# Patient Record
Sex: Female | Born: 1970 | Hispanic: Yes | Marital: Married | Smoking: Never smoker
Health system: Southern US, Community
[De-identification: ages and names within clinical notes are randomized; demographics above are authoritative.]

## PROBLEM LIST (undated history)

## (undated) DIAGNOSIS — N841 Polyp of cervix uteri: Secondary | ICD-10-CM

## (undated) DIAGNOSIS — E785 Hyperlipidemia, unspecified: Secondary | ICD-10-CM

## (undated) DIAGNOSIS — F329 Major depressive disorder, single episode, unspecified: Secondary | ICD-10-CM

## (undated) DIAGNOSIS — F32A Depression, unspecified: Secondary | ICD-10-CM

## (undated) HISTORY — DX: Depression, unspecified: F32.A

## (undated) HISTORY — DX: Hyperlipidemia, unspecified: E78.5

## (undated) HISTORY — DX: Polyp of cervix uteri: N84.1

---
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
## 2014-11-27 ENCOUNTER — Other Ambulatory Visit (HOSPITAL_COMMUNITY): Payer: Self-pay | Admitting: *Deleted

## 2014-11-27 DIAGNOSIS — R221 Localized swelling, mass and lump, neck: Secondary | ICD-10-CM

## 2014-12-02 ENCOUNTER — Ambulatory Visit (HOSPITAL_COMMUNITY)
Admission: RE | Admit: 2014-12-02 | Discharge: 2014-12-02 | Disposition: A | Discharge status: Home / Self Care | Payer: Self-pay | Source: Ambulatory Visit | Attending: *Deleted | Admitting: *Deleted

## 2014-12-02 DIAGNOSIS — R221 Localized swelling, mass and lump, neck: Secondary | ICD-10-CM

## 2014-12-02 DIAGNOSIS — E041 Nontoxic single thyroid nodule: Secondary | ICD-10-CM | POA: Insufficient documentation

## 2015-01-01 ENCOUNTER — Ambulatory Visit (INDEPENDENT_AMBULATORY_CARE_PROVIDER_SITE_OTHER): Payer: Self-pay | Admitting: Otolaryngology

## 2015-01-01 DIAGNOSIS — D440 Neoplasm of uncertain behavior of thyroid gland: Secondary | ICD-10-CM

## 2015-01-06 ENCOUNTER — Other Ambulatory Visit (INDEPENDENT_AMBULATORY_CARE_PROVIDER_SITE_OTHER): Payer: Self-pay | Admitting: Otolaryngology

## 2015-01-06 DIAGNOSIS — IMO0002 Reserved for concepts with insufficient information to code with codable children: Secondary | ICD-10-CM

## 2015-01-06 DIAGNOSIS — R229 Localized swelling, mass and lump, unspecified: Principal | ICD-10-CM

## 2015-01-16 ENCOUNTER — Other Ambulatory Visit (INDEPENDENT_AMBULATORY_CARE_PROVIDER_SITE_OTHER): Payer: Self-pay | Admitting: Otolaryngology

## 2015-01-16 ENCOUNTER — Ambulatory Visit (HOSPITAL_COMMUNITY)
Admission: RE | Admit: 2015-01-16 | Discharge: 2015-01-16 | Disposition: A | Discharge status: Home / Self Care | Payer: Self-pay | Source: Ambulatory Visit | Attending: Otolaryngology | Admitting: Otolaryngology

## 2015-01-16 DIAGNOSIS — IMO0002 Reserved for concepts with insufficient information to code with codable children: Secondary | ICD-10-CM

## 2015-01-16 DIAGNOSIS — E041 Nontoxic single thyroid nodule: Secondary | ICD-10-CM | POA: Insufficient documentation

## 2015-01-16 DIAGNOSIS — R229 Localized swelling, mass and lump, unspecified: Principal | ICD-10-CM

## 2015-01-16 MED ORDER — LIDOCAINE HCL (PF) 2 % IJ SOLN
INTRAMUSCULAR | Status: AC
  Administered 2015-01-16: 10 mL
  Filled 2015-01-16: qty 10

## 2015-01-16 NOTE — Discharge Instructions (Addendum)
Quiste en la tiroides  (Thyroid Cyst) La glndula tiroides tiene forma de [place] y se encuentra en el medio del cuello, justo por debajo de las cuerdas vocales. Produce la hormona tiroidea. La hormona tiroidea tiene efecto en casi todos los tejidos del [place], regulando el metabolismo. El metabolismo es la utilizacin de los alimentos que se consumen o de la energa almacenada en el organismo. El metabolismo afecta la frecuencia cardaca, la presin arterial, la Firefighter y [place].  Un quiste en la tiroides es una zona agrandada, llena de lquido en la glndula tiroides. Estos quistes varan en tamao y pueden expandirse y agrandarse repentinamente. La rpida expansin de un quiste puede causar dolor, dificultad para tragar y, rara vez, dificultad para respirar. La mayora de los quistes de la glndula tiroides son no cancerosos (benignos).  SNTOMAS  Dentro del quiste puede haber sangrado. Si la hemorragia es intensa, el quiste puede agrandarse y causar problemas en el cuello, por ejemplo inflamacin, que se acompaa de dolor y dificultad para tragar. Si comprime las cuerdas vocales, puede producir ronquera. Si comprime la trquea, puede causar dificultad para respirar.  DIAGNSTICO  Un quiste de la tiroides se Engineer, materials un examen fsico. El diagnstico se puede confirmar mediante una ecografa del cuello. Con este estudio se crea una imagen haciendo rebotar ondas de sonido en la glndula tiroides. En [employer], los quistes se drenan mediante una aguja fina. El lquido se enva a un laboratorio para ser examinado. Esto se hace para ver si las clulas que se encuentran en el lquido son cancerosas. Si esas clulas son cancerosas, deber Arts administrator.  TRATAMIENTO  Si el lquido del cuello no [place] evidencia de cncer, el mdico simplemente lo controlar con ecografas anuales. En algunos casos, los quistes se extirparn quirrgicamente.  Document Released:  04/18/2012 [facility] Patient Information 2015 [place]. This information is not intended to replace advice given to you by your health care provider. Make sure you discuss any questions you have with your health care provider. Biopsia de tiroides (Thyroid Biopsy) La glndula tiroides es una glndula con forma de mariposa situada en parte frontal del cuello. Produce hormonas que afectan el metabolismo, el crecimiento y el desarrollo y la Firefighter. Tara Heath biopsia de tiroides es un procedimiento mediante el cual se extraen muestras de tejido o de lquido de la glndula tiroides o de una masa y se examinan con un microscopio. Esta prueba se realiza con el objetivo de determinar la causa de problemas en la tiroides, como infeccin, cncer u otros problemas de tiroides. Hay 2 modos de obtener muestras: 1. Aspiracin con [employer] se extraen utilizando una aguja fina que se inserta a travs de la piel en la glndula tiroides o en la masa. [employer] se extraen haciendo un corte (incisin) a travs de la piel. INFORME A SU MDICO SOBRE:   Alergias.  Medicamentos que toma, incluidas hierbas, gotas oftlmicas, medicamentos de venta libre y cremas.  Uso de corticoides (por va oral o cremas).  Problemas anteriores debido a anestsicos o a medicamentos que [employer] sensibilidad.  Posibilidad de embarazo, si correspondiera.  Antecedentes de cogulos sanguneos (tromboflebitis).  Antecedentes de hemorragias o problemas sanguneos.  Cirugas previas.  Otros problemas de [place]. RIESGOS Y COMPLICACIONES  Sangrado proveniente del lugar. El riesgo de sangrado es mayor si usted sufre un trastorno hemorrgico o est tomando anticoagulantes.  Infeccin.  Lesin en estructuras cercanas a la glndula tiroides. ANTES DEL  PROCEDIMIENTO  Es un procedimiento que se realiza como paciente externo Confirme la hora en que debe llegar al procedimiento.  Confirme si debe ayunar o suspender algn medicamento. Le tomarn Truddie Coco de sangre para determinar el tiempo de coagulacin. Es posible que le den un medicamento para que pueda relajarse (sedante). PROCEDIMIENTO Aspiracin con aguja fina Durante el procedimiento permanecer despierto. Es posible que le soliciten que se recueste de espaldas, con la cabeza hacia atrs para que pueda extender el cuello. Hgale saber al mdico si no puede tolerar esta posicin. Le desinfectarn una zona del cuello. Se insertar una aguja a travs de la piel del cuello. Quizs sienta una leve molestia durante este procedimiento. Es posible que le pidan que evite toser, Electrical engineer, tragar o hacer sonidos durante algunas partes del procedimiento. Una vez que se hayan extrado las muestras de tejido o lquido, se retira la aguja. Se puede aplicar presin en el cuello para reducir la hinchazn y asegurarse de que se haya detenido el sangrado. Las muestras se envan para ser examinadas.  Biopsia abierta Recibir anestesia general. Estar dormido durante el procedimiento. Se hace una incisin en el cuello. Se retira una muestra de tejido de tiroides o de la masa. Las muestras de tejido o masa se envan para ser examinadas. Quizs se examinen las muestras o masa durante la biopsia. Si en la muestra o en la masa se detecta la presencia de clulas cancerosas, es posible que se retire toda o parte de la glndula tiroides. La incisin se cierra con puntos de sutura. DESPUS DEL PROCEDIMIENTO  Evaluarn y controlarn su recuperacin. Si no hay problemas, podr volver a su casa poco tiempo despus del procedimiento Si se le realiz una biopsia con aguja:  Puede experimentar molestias en el lugar de la biopsia durante 1 o 2 das. Si se le realiz una biopsia abierta:   Puede experimentar molestias en el lugar de la biopsia durante 3 o 4 das.  Quizs tenga la voz ronca o le duela la garganta durante 1 o 2 das. Obtencin de los  Edisto de las pruebas Es su responsabilidad retirar el resultado del [place]. No suponga que es normal si no tiene noticias de su mdico o del establecimiento de salud. Es Building services engineer seguimiento de todos los [place] de [place]. INSTRUCCIONES PARA EL CUIDADO EN EL HOGAR   Mantener la cabeza elevada en una almohada cuando est recostado puede ayudar a Public house manager las [employer] en el lugar de la biopsia.  Sostenerse la parte posterior de la cabeza y el cuello con ambas manos al incorporarse cuando est recostado puede ayudarle a Public house manager las molestias en el lugar de la biopsia.  Utilice los medicamentos de venta libre o recetados para Glass blower/designer, el malestar o la fiebre, segn se lo indique el mdico.  Las pastillas para la garganta o las grgaras con agua tibia y sal pueden ayudarle a Best boy de Investment banker, operational. SOLICITE ATENCIN MDICA DE INMEDIATO SI:   Tiene sangrado abundante en el lugar de la biopsia.  Tiene dificultad para tragar.  Tiene fiebre.  Aument el dolor, la hinchazn, el enrojecimiento o Engineer, production de la biopsia.  Tiene pus en el lugar de la biopsia.  Las glndulas del cuello (ganglios linfticos) estn inflamados. Document Released: 02/20/2013 [facility] Patient Information 2015 [place]. This information is not intended to replace advice given to you by your health care provider. Make sure you discuss any questions you have with your health care  provider.

## 2015-01-16 NOTE — Procedures (Signed)
PreOperative Dx: Complicated cystic lesion RT thyroid lobe Postoperative Dx: Complicated cystic lesion RT thyroid lobe Procedure:   US guided aspiration of cystic RT thyroid lesion Radiologist:  Thornton Papas Anesthesia:  2 ml of 2% lidocaine Specimen:  6.5 ml of greenish brown fluid EBL:   < 1 ml Complications: None

## 2015-02-12 ENCOUNTER — Ambulatory Visit (INDEPENDENT_AMBULATORY_CARE_PROVIDER_SITE_OTHER): Payer: Self-pay | Admitting: Otolaryngology

## 2015-02-12 DIAGNOSIS — D440 Neoplasm of uncertain behavior of thyroid gland: Secondary | ICD-10-CM

## 2015-08-11 ENCOUNTER — Other Ambulatory Visit (INDEPENDENT_AMBULATORY_CARE_PROVIDER_SITE_OTHER): Payer: Self-pay | Admitting: Otolaryngology

## 2015-08-11 DIAGNOSIS — E079 Disorder of thyroid, unspecified: Secondary | ICD-10-CM

## 2015-08-14 ENCOUNTER — Ambulatory Visit (HOSPITAL_COMMUNITY): Admission: RE | Admit: 2015-08-14 | Payer: Self-pay | Source: Ambulatory Visit

## 2016-01-28 ENCOUNTER — Ambulatory Visit (INDEPENDENT_AMBULATORY_CARE_PROVIDER_SITE_OTHER): Payer: Self-pay | Admitting: Otolaryngology

## 2016-05-13 ENCOUNTER — Other Ambulatory Visit (INDEPENDENT_AMBULATORY_CARE_PROVIDER_SITE_OTHER): Payer: Self-pay | Admitting: Otolaryngology

## 2016-05-13 DIAGNOSIS — E041 Nontoxic single thyroid nodule: Secondary | ICD-10-CM

## 2016-05-19 ENCOUNTER — Ambulatory Visit (HOSPITAL_COMMUNITY)
Admission: RE | Admit: 2016-05-19 | Discharge: 2016-05-19 | Disposition: A | Discharge status: Home / Self Care | Payer: BLUE CROSS/BLUE SHIELD | Source: Ambulatory Visit | Attending: Otolaryngology | Admitting: Otolaryngology

## 2016-05-19 DIAGNOSIS — E041 Nontoxic single thyroid nodule: Secondary | ICD-10-CM

## 2016-06-16 ENCOUNTER — Ambulatory Visit (INDEPENDENT_AMBULATORY_CARE_PROVIDER_SITE_OTHER): Payer: BLUE CROSS/BLUE SHIELD | Admitting: Otolaryngology

## 2016-06-16 DIAGNOSIS — D440 Neoplasm of uncertain behavior of thyroid gland: Secondary | ICD-10-CM

## 2017-06-19 ENCOUNTER — Ambulatory Visit (INDEPENDENT_AMBULATORY_CARE_PROVIDER_SITE_OTHER): Payer: BLUE CROSS/BLUE SHIELD | Admitting: Otolaryngology

## 2017-06-19 DIAGNOSIS — D440 Neoplasm of uncertain behavior of thyroid gland: Secondary | ICD-10-CM

## 2017-06-19 DIAGNOSIS — J310 Chronic rhinitis: Secondary | ICD-10-CM | POA: Diagnosis not present

## 2018-05-28 ENCOUNTER — Other Ambulatory Visit: Payer: Self-pay | Admitting: Otolaryngology

## 2018-05-28 DIAGNOSIS — E041 Nontoxic single thyroid nodule: Secondary | ICD-10-CM

## 2018-06-01 ENCOUNTER — Encounter (HOSPITAL_COMMUNITY): Payer: Self-pay

## 2018-06-01 ENCOUNTER — Ambulatory Visit (HOSPITAL_COMMUNITY): Payer: PRIVATE HEALTH INSURANCE

## 2019-03-20 ENCOUNTER — Other Ambulatory Visit (HOSPITAL_COMMUNITY): Payer: Self-pay | Admitting: Family Medicine

## 2019-03-20 DIAGNOSIS — Z1231 Encounter for screening mammogram for malignant neoplasm of breast: Secondary | ICD-10-CM

## 2019-03-28 ENCOUNTER — Other Ambulatory Visit: Payer: Self-pay

## 2019-03-28 ENCOUNTER — Encounter (HOSPITAL_COMMUNITY): Payer: Self-pay

## 2019-03-28 ENCOUNTER — Ambulatory Visit (HOSPITAL_COMMUNITY)
Admission: RE | Admit: 2019-03-28 | Discharge: 2019-03-28 | Disposition: A | Discharge status: Home / Self Care | Payer: PRIVATE HEALTH INSURANCE | Source: Ambulatory Visit | Attending: Family Medicine | Admitting: Family Medicine

## 2019-03-28 DIAGNOSIS — Z1231 Encounter for screening mammogram for malignant neoplasm of breast: Secondary | ICD-10-CM | POA: Insufficient documentation

## 2019-09-30 ENCOUNTER — Other Ambulatory Visit: Payer: PRIVATE HEALTH INSURANCE | Admitting: Adult Health

## 2019-10-03 ENCOUNTER — Ambulatory Visit (INDEPENDENT_AMBULATORY_CARE_PROVIDER_SITE_OTHER): Payer: 59 | Admitting: Adult Health

## 2019-10-03 ENCOUNTER — Other Ambulatory Visit: Payer: Self-pay

## 2019-10-03 ENCOUNTER — Encounter: Payer: Self-pay | Admitting: Adult Health

## 2019-10-03 VITALS — BP 124/79 | HR 73 | Ht 62.75 in | Wt 149.0 lb

## 2019-10-03 DIAGNOSIS — Z01419 Encounter for gynecological examination (general) (routine) without abnormal findings: Secondary | ICD-10-CM | POA: Diagnosis not present

## 2019-10-03 NOTE — Progress Notes (Signed)
Patient ID: Tara Heath, female   DOB: 07-Nov-1970, 49 y.o.   MRN: [id number] History of Present Illness: Kaelah is a 49 year old Hispanic female, married, G3P3 in for a well woman gyn exam.Had normal pap 11/2017.Sharyn Lull is interrupter today. PCP Dr Channing Mutters has to change due to insurance   Current Medications, Allergies, Past Medical History, Past Surgical History, Family History and Social History were reviewed in [employer] record.     Review of Systems: Patient denies any  hearing loss, fatigue, blurred vision, shortness of breath, chest pain, abdominal pain, problems with bowel movements, urination, or intercourse. No joint pain or mood swings. Periods monthly Has headaches most days she says PCP told her sinus    Physical Exam:BP 124/79 (BP Location: Left Arm, Patient Position: Sitting, Cuff Size: Normal)   Pulse 73   Ht 5' 2.75" (1.594 m)   Wt 149 lb (67.6 kg)   LMP 10/01/2019   BMI 26.60 kg/m  General:  Well developed, well nourished, no acute distress Skin:  Warm and dry Neck:  Midline trachea, normal thyroid, good ROM, no lymphadenopathy Lungs; Clear to auscultation bilaterally Breast:  No dominant palpable mass, retraction, or nipple discharge Cardiovascular: Regular rate and rhythm Abdomen:  Soft, non tender, no hepatosplenomegaly Pelvic:  External genitalia is normal in appearance, no lesions.  The vagina is normal in appearance. +period blood in vault. Urethra has no lesions or masses. The cervix is bulbous.  Uterus is felt to be normal size, shape, and contour.  No adnexal masses or tenderness noted.Bladder is non tender, no masses felt. Rectal: Good sphincter tone, no polyps, or hemorrhoids felt.  Did not have hemoccult done due to period blood. Extremities/musculoskeletal:  No swelling or varicosities noted, no clubbing or cyanosis Psych:  No mood changes, alert and cooperative,seems happy Fall risk is low PHQ 9 score is 3  Impression and  Plan:   1. Encounter for well woman exam with routine gynecological exam Physical and pap in 1 year Check CBC,CMP,TSH and lipids Mammogram yearly Colonoscopy at 1

## 2019-11-20 LAB — CBC
Hematocrit: 39.8 % (ref 34.0–46.6)
Hemoglobin: 13.5 g/dL (ref 11.1–15.9)
MCH: 30 pg (ref 26.6–33.0)
MCHC: 33.9 g/dL (ref 31.5–35.7)
MCV: 88 fL (ref 79–97)
Platelets: 264 10*3/uL (ref 150–450)
RBC: 4.5 x10E6/uL (ref 3.77–5.28)
RDW: 12.7 % (ref 11.7–15.4)
WBC: 8.8 10*3/uL (ref 3.4–10.8)

## 2019-11-20 LAB — COMPREHENSIVE METABOLIC PANEL
ALT: 22 IU/L (ref 0–32)
AST: 21 IU/L (ref 0–40)
Albumin/Globulin Ratio: 1.6 (ref 1.2–2.2)
Albumin: 4.6 g/dL (ref 3.8–4.8)
Alkaline Phosphatase: 72 IU/L (ref 48–121)
BUN/Creatinine Ratio: 15 (ref 9–23)
BUN: 10 mg/dL (ref 6–24)
Bilirubin Total: 0.5 mg/dL (ref 0.0–1.2)
CO2: 25 mmol/L (ref 20–29)
Calcium: 9.9 mg/dL (ref 8.7–10.2)
Chloride: 102 mmol/L (ref 96–106)
Creatinine, Ser: 0.68 mg/dL (ref 0.57–1.00)
GFR calc Af Amer: 119 mL/min/{1.73_m2} (ref >59)
GFR calc non Af Amer: 103 mL/min/{1.73_m2} (ref >59)
Globulin, Total: 2.9 g/dL (ref 1.5–4.5)
Glucose: 86 mg/dL (ref 65–99)
Potassium: 4 mmol/L (ref 3.5–5.2)
Sodium: 139 mmol/L (ref 134–144)
Total Protein: 7.5 g/dL (ref 6.0–8.5)

## 2019-11-20 LAB — LIPID PANEL
Chol/HDL Ratio: 3.3 ratio (ref 0.0–4.4)
Cholesterol, Total: 211 mg/dL — ABNORMAL HIGH (ref 100–199)
HDL: 64 mg/dL (ref >39)
LDL Chol Calc (NIH): 135 mg/dL — ABNORMAL HIGH (ref 0–99)
Triglycerides: 70 mg/dL (ref 0–149)
VLDL Cholesterol Cal: 12 mg/dL (ref 5–40)

## 2019-11-20 LAB — TSH: TSH: 1.38 u[IU]/mL (ref 0.450–4.500)

## 2019-11-22 ENCOUNTER — Telehealth: Payer: Self-pay | Admitting: Adult Health

## 2019-11-22 NOTE — Telephone Encounter (Signed)
I contacted patient regarding her lab results and explained exactly what the provider has stated to her in [place], pt stated that she understood what I explained.

## 2019-12-24 ENCOUNTER — Telehealth: Payer: Self-pay | Admitting: Adult Health

## 2019-12-24 NOTE — Telephone Encounter (Signed)
Left message letting pt know results from July. See results and Jenn's note. [place]

## 2019-12-24 NOTE — Telephone Encounter (Signed)
Patient would like results from labs done in July

## 2020-01-07 ENCOUNTER — Other Ambulatory Visit (HOSPITAL_COMMUNITY): Payer: Self-pay | Admitting: Otolaryngology

## 2020-01-07 ENCOUNTER — Other Ambulatory Visit: Payer: Self-pay | Admitting: Otolaryngology

## 2020-01-07 DIAGNOSIS — E041 Nontoxic single thyroid nodule: Secondary | ICD-10-CM

## 2020-01-17 ENCOUNTER — Other Ambulatory Visit: Payer: Self-pay

## 2020-01-17 ENCOUNTER — Ambulatory Visit (HOSPITAL_COMMUNITY)
Admission: RE | Admit: 2020-01-17 | Discharge: 2020-01-17 | Disposition: A | Discharge status: Home / Self Care | Payer: 59 | Source: Ambulatory Visit | Attending: Otolaryngology | Admitting: Otolaryngology

## 2020-01-17 DIAGNOSIS — E041 Nontoxic single thyroid nodule: Secondary | ICD-10-CM | POA: Diagnosis present

## 2020-03-24 ENCOUNTER — Other Ambulatory Visit (HOSPITAL_COMMUNITY): Payer: Self-pay | Admitting: Family Medicine

## 2020-03-24 DIAGNOSIS — Z1231 Encounter for screening mammogram for malignant neoplasm of breast: Secondary | ICD-10-CM

## 2020-04-01 ENCOUNTER — Ambulatory Visit (HOSPITAL_COMMUNITY)
Admission: RE | Admit: 2020-04-01 | Discharge: 2020-04-01 | Disposition: A | Discharge status: Home / Self Care | Payer: 59 | Source: Ambulatory Visit | Attending: Family Medicine | Admitting: Family Medicine

## 2020-04-01 ENCOUNTER — Other Ambulatory Visit: Payer: Self-pay

## 2020-04-01 DIAGNOSIS — Z1231 Encounter for screening mammogram for malignant neoplasm of breast: Secondary | ICD-10-CM | POA: Diagnosis present

## 2020-09-25 ENCOUNTER — Encounter: Payer: 59 | Admitting: Women's Health

## 2020-09-25 ENCOUNTER — Other Ambulatory Visit: Payer: Self-pay

## 2020-09-28 NOTE — Progress Notes (Signed)
This encounter was created in error - please disregard.

## 2020-10-07 ENCOUNTER — Ambulatory Visit (INDEPENDENT_AMBULATORY_CARE_PROVIDER_SITE_OTHER): Payer: 59 | Admitting: Women's Health

## 2020-10-07 ENCOUNTER — Other Ambulatory Visit (HOSPITAL_COMMUNITY)
Admission: RE | Admit: 2020-10-07 | Discharge: 2020-10-07 | Disposition: A | Discharge status: Home / Self Care | Payer: 59 | Source: Ambulatory Visit | Attending: Obstetrics & Gynecology | Admitting: Obstetrics & Gynecology

## 2020-10-07 ENCOUNTER — Other Ambulatory Visit: Payer: Self-pay

## 2020-10-07 ENCOUNTER — Encounter: Payer: Self-pay | Admitting: Women's Health

## 2020-10-07 VITALS — BP 109/67 | HR 71 | Ht 62.0 in | Wt 167.0 lb

## 2020-10-07 DIAGNOSIS — N926 Irregular menstruation, unspecified: Secondary | ICD-10-CM | POA: Diagnosis not present

## 2020-10-07 DIAGNOSIS — Z01419 Encounter for gynecological examination (general) (routine) without abnormal findings: Secondary | ICD-10-CM | POA: Diagnosis not present

## 2020-10-07 DIAGNOSIS — Z1212 Encounter for screening for malignant neoplasm of rectum: Secondary | ICD-10-CM

## 2020-10-07 DIAGNOSIS — Z1211 Encounter for screening for malignant neoplasm of colon: Secondary | ICD-10-CM

## 2020-10-07 DIAGNOSIS — N841 Polyp of cervix uteri: Secondary | ICD-10-CM | POA: Diagnosis not present

## 2020-10-07 NOTE — Progress Notes (Signed)
WELL-WOMAN EXAMINATION Patient name: Tara Heath MRN [id number]  Date of birth: 1970/06/26 Chief Complaint:   Annual Exam (Longer periods)  History of Present Illness:   Tara Heath is a 50 y.o. G87P3003 Hispanic female being seen today for a routine well-woman exam.  Current complaints: longer periods for a little over a year. One/mth, 3-4d of lighter spotting, then 5d normal period, then 3-4d of lighter spotting after. Changes pad 2x/day, no cramps/pain, small 'snake-like' clots in toilet. Denies abnormal discharge, itching/odor/irritation.  Unsure of mom's age when she went through menopause. Does have some vaginal dryness, no hot flashes/night sweats/mood swings.  H/O cervical polyp  Depression screen [facility] 2/9 10/03/2019  Decreased Interest 1  Down, Depressed, Hopeless 1  PHQ - 2 Score 2  Altered sleeping 0  Tired, decreased energy 0  Change in appetite 1  Feeling bad or failure about yourself  0  Trouble concentrating 0  Moving slowly or fidgety/restless 0  Suicidal thoughts 0  PHQ-9 Score 3     PCP: Don-Diego      does not desire labs Patient's last menstrual period was 09/14/2020. The current method of family planning is husband out of country.  Last pap 11/27/17. Results were: ASCUS w/ HRHPV negative. H/O abnormal pap: yes Last mammogram: 04/01/20. Results were: normal. Family h/o breast cancer: yes MA in 20s Last colonoscopy: never. Results were: N/A. Family h/o colorectal cancer: no Review of Systems:   Pertinent items are noted in HPI Denies any headaches, blurred vision, fatigue, shortness of breath, chest pain, abdominal pain, abnormal vaginal discharge/itching/odor/irritation, problems with periods, bowel movements, urination, or intercourse unless otherwise stated above. Pertinent History Reviewed:  Reviewed past medical,surgical, social and family history.  Reviewed problem list, medications and allergies. Physical Assessment:   Vitals:   10/07/20 0909   BP: 109/67  Pulse: 71  Weight: 167 lb (75.8 kg)  Height: 5\' 2"  (1.575 m)  Body mass index is 30.54 kg/m.        Physical Examination:   General appearance - well appearing, and in no distress  Mental status - alert, oriented to person, place, and time  Psych:  She has a normal mood and affect  Skin - warm and dry, normal color, no suspicious lesions noted  Chest - effort normal, all lung fields clear to auscultation bilaterally  Heart - normal rate and regular rhythm  Neck:  midline trachea, no thyromegaly or nodules  Breasts - breasts appear normal, no suspicious masses, no skin or nipple changes or  axillary nodes  Abdomen - soft, nontender, nondistended, no masses or organomegaly  Pelvic - VULVA: normal appearing vulva with no masses, tenderness or lesions  VAGINA: normal appearing vagina with normal color and discharge, no lesions  CERVIX: normal appearing cervix without discharge or lesions, no CMT, small flat appearing polyp at os  Thin prep pap is done w/ HR HPV cotesting  UTERUS: uterus is felt to be normal size, shape, consistency and nontender   ADNEXA: No adnexal masses or tenderness noted.  Extremities:  No swelling or varicosities noted  Chaperone: Latisha Cresenzo    No results found for this or any previous visit (from the past 24 hour(s)).  Assessment & Plan:  1) Well-Woman Exam  2) Prolonged periods> check gc/ct w/ pap, will get pelvic u/s (order placed and note routed to [facility] to schedule/notify pt). Has small flat cervical polyp. Could also be perimenopausal.  Will discuss further w/ pt after u/s. Does not want meds/birth control  to regulate if everything comes back normal.   3) Small flat cervical polyp> h/o same, will discuss further after u/s  4) Needs for screening TCS> ordered referral to GI  Labs/procedures today: pap w/ gc/ct  Mammogram: after 11/17, or sooner if problems Colonoscopy: schedule screening colonoscopy as soon as possible   Orders  Placed This Encounter  Procedures  . US PELVIS (TRANSABDOMINAL ONLY)  . US PELVIS TRANSVAGINAL NON-OB (TV ONLY)  . Ambulatory referral to Gastroenterology    Meds: No orders of the defined types were placed in this encounter.   Follow-up: No follow-ups on file.  [place], [facility] 10/07/2020 9:41 AM

## 2020-10-07 NOTE — Patient Instructions (Signed)
Schedule your mammogram for after 11/17 this year

## 2020-10-08 LAB — CYTOLOGY - PAP
Chlamydia: NEGATIVE
Comment: NEGATIVE
Comment: NEGATIVE
Comment: NORMAL
Diagnosis: NEGATIVE
High risk HPV: NEGATIVE
Neisseria Gonorrhea: NEGATIVE

## 2020-10-16 ENCOUNTER — Ambulatory Visit (HOSPITAL_COMMUNITY): Payer: 59

## 2020-10-22 ENCOUNTER — Ambulatory Visit (HOSPITAL_COMMUNITY)
Admission: RE | Admit: 2020-10-22 | Discharge: 2020-10-22 | Disposition: A | Discharge status: Home / Self Care | Payer: 59 | Source: Ambulatory Visit | Attending: Women's Health | Admitting: Women's Health

## 2020-10-22 ENCOUNTER — Other Ambulatory Visit: Payer: Self-pay | Admitting: Women's Health

## 2020-10-22 DIAGNOSIS — N926 Irregular menstruation, unspecified: Secondary | ICD-10-CM

## 2020-10-23 ENCOUNTER — Encounter: Payer: Self-pay | Admitting: *Deleted

## 2020-11-23 ENCOUNTER — Ambulatory Visit: Payer: 59

## 2020-11-24 ENCOUNTER — Encounter (HOSPITAL_COMMUNITY): Payer: Self-pay

## 2020-11-24 ENCOUNTER — Ambulatory Visit (HOSPITAL_COMMUNITY): Payer: 59 | Attending: Internal Medicine

## 2020-11-24 ENCOUNTER — Other Ambulatory Visit: Payer: Self-pay

## 2020-11-24 DIAGNOSIS — M25521 Pain in right elbow: Secondary | ICD-10-CM

## 2020-11-24 DIAGNOSIS — M25522 Pain in left elbow: Secondary | ICD-10-CM | POA: Insufficient documentation

## 2020-11-24 NOTE — Therapy (Signed)
[facility] [address] [place], [place], [address] Phone: [phone]   Fax:  [phone]  Occupational Therapy Evaluation  Patient Details  Name: Tara Heath MRN: [id number] Date of Birth: 1970/11/15 Referring Provider (OT): Roberto Scales, MD   Encounter Date: 11/24/2020   OT End of Session - 11/24/20 1105     Visit Number 1    Number of Visits 2    Date for OT Re-Evaluation 12/22/20    Authorization Type [employer]    Authorization Time Period [place] $10, no authorization, visit limit: 30    Authorization - Visit Number 1    Authorization - Number of Visits 30    OT Start Time 0955    OT Stop Time 1517    OT Time Calculation (min) 38 min    Activity Tolerance Patient tolerated treatment well    Behavior During Therapy [facility] for tasks assessed/performed           Pain:  Upon waking: right: 8/10, left: 6/10 Working: right: 6/10, left: 6/10  Past Medical History:  Diagnosis Date   Cervical polyp    Depression    Hyperlipidemia     History reviewed. No pertinent surgical history.  There were no vitals filed for this visit.   Subjective Assessment - 11/24/20 1000     Subjective  S: I try to keep my elbows straight at night.    Patient is accompanied by: Interpreter    Pertinent History Patient is a 50 y/o female presenting with bilateral hand numbness and pain which has been ongoing for at least a year. Patient reports that she experiences constant pain with it aggrevated first thing in the morning and when completing work tasks. When elbows are bent, she experiences bilateral numbness in her middle, ring, and small fingers which decreases when elbows are extended. These symptoms mimicing cubital tunnel syndrome. Dr. Jimmye Norman has referred patient to occupational therapy for evaluation and treatment.    Patient Stated Goals To decrease pain and numbness    Currently in Pain? Yes    Pain Score 3     Pain Location  Hand    Pain Orientation Right    Pain Descriptors / Indicators Aching;Constant    Pain Type Chronic pain    Pain Onset More than a month ago    Pain Frequency Constant    Aggravating Factors  worse upon waking up in the morning, work related tasks, elbow bent (sleeping and driving)    Pain Relieving Factors extending elbow straight    Effect of Pain on Daily Activities severe effect    Multiple Pain Sites Yes    Pain Score 1    Pain Location Hand    Pain Orientation Left    Pain Descriptors / Indicators Aching;Constant    Pain Type Chronic pain    Pain Onset More than a month ago    Pain Frequency Constant    Aggravating Factors  worse upon waking up in the morning, work related tasks, elbow bent (sleeping and driving)    Pain Relieving Factors extending elbow straight    Effect of Pain on Daily Activities severe               [facility] OT Assessment - 11/24/20 1009       Assessment   Medical Diagnosis bilateral hand numbness/pain    Referring Provider (OT) Roberto Scales, MD    Onset Date/Surgical Date --   1 year ago  Hand Dominance Right    Next MD Visit --   in 6 months   Prior Therapy None      Precautions   Precautions None      Restrictions   Weight Bearing Restrictions No      Balance Screen   Has the patient fallen in the past 6 months No      Home  Environment   Family/patient expects to be discharged to: Private residence      Prior Function   Level of Independence Independent    Vocation Full time employment    Vocation Requirements 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      ADL   ADL comments Pt reports increased pain and numbness when completing work tasks, driving and holding steering wheel with elbow bent, when attempting to sleep at night and elbow are bent, unable to form a fist with either hand first thing in the morning.      Mobility   Mobility Status Independent      Written  Expression   Dominant Hand Right      Vision - History   Baseline Vision No visual deficits      Cognition   Overall Cognitive Status Within Functional Limits for tasks assessed      Observation/Other Assessments   Focus on Therapeutic Outcomes (FOTO)  N/A      Sensation   Light Touch Appears Intact    Stereognosis Appears Intact    Hot/Cold Appears Intact    Proprioception Appears Intact    Additional Comments Numbness aggrevated when elbow bent. Numbness occurs in bilateral middle, ring, and small fingers.      Coordination   Gross Motor Movements are Fluid and Coordinated Yes    Fine Motor Movements are Fluid and Coordinated Yes    Coordination WNL      Edema   Edema None noted.      ROM / Strength   AROM / PROM / Strength Strength;AROM      AROM   Overall AROM  Within functional limits for tasks performed    Overall AROM Comments bilateral shoulders, wrist,elbows, and hands.      Strength   Overall Strength Comments BUE strength is WFL in all ranges for shoulder, elbow, forearm, and wrists.    Strength Assessment Site Hand    Right/Left hand Right;Left    Right Hand Grip (lbs) 65    Left Hand Grip (lbs) 60                             OT Education - 11/24/20 1103     Education Details Education provided regarding activity/task modifications at work, home, when driving and sleeping. Ulnar nerve glides provided for HEP. Anti-vibration gloves recommended for work. Bilateral elbow splints for night time.    Person(s) Educated Patient    Methods Explanation;Demonstration;Verbal cues;Handout    Comprehension Returned demonstration;Verbalized understanding              OT Short Term Goals - 11/24/20 1114       OT SHORT TERM GOAL #1   Title Patient will be educated and verbalize understanding of HEP and education provided in order to decrease the intensity/occurance of numbness and pain that is experienced bilaterally.    Time 4    Period  Weeks    Status New    Target Date 12/22/20  OT SHORT TERM GOAL #2   Title Patient will be provided with appropriate nightime elbow splints while either verbalizing or understanding donning/doffing technique, wearing schedule, precautions, and care.    Time 4    Period Weeks    Status New                      Plan - 11/24/20 1109     Clinical Impression Statement A: Patient is a 50 y/o female experiencing bilateral hand pain and numbness which appears to be caused from work required tasks and excessive elbow flexion resulting in difficulty in all aspects of work, sleep, and ADL tasks. Discussed the cause of numbness related to ulnar nerve compression. Provide ulnar nerve glides, recommended eliminating excessive elbow flexion when able, use of anti-vibration gloves, and utilzing elbow splints at night for ideal elbow positioning.    OT Occupational Profile and History Problem Focused Assessment - Including review of records relating to presenting problem    Occupational performance deficits (Please refer to evaluation for details): ADL's;Rest and Sleep;Work;Leisure    Rehab Potential Excellent    Clinical Decision Making Limited treatment options, no task modification necessary    Comorbidities Affecting Occupational Performance: None    Modification or Assistance to Complete Evaluation  No modification of tasks or assist necessary to complete eval    OT Frequency Other (comment)   1 follow up visit for splint fabrication   OT Duration 4 weeks    OT Treatment/Interventions Patient/family education;Splinting;Self-care/ADL training    Plan P: Patient will benefit from skilled OT services to provide education and splint fabrication for bilateral elbows in order to assist with decreasing pain and numbness that is felt during daily tasks. Treatment plan: fabricate bilateral elbow splints for night time use. 90 minute session needed. Follow up on ulnar nerve glides.    OT Home  Exercise Plan eval: ulnar nerve glides    Consulted and Agree with Plan of Care Patient             Patient will benefit from skilled therapeutic intervention in order to improve the following deficits and impairments:           Visit Diagnosis: Pain in left elbow - Plan: Ot plan of care cert/re-cert  Pain in right elbow - Plan: Ot plan of care cert/re-cert    Problem List Patient Active Problem List   Diagnosis Date Noted   Cervical polyp 10/07/2020    Ailene Ravel, OTR/L,CBIS  [phone]   11/24/2020, 11:29 AM  [place], [place], [address] Phone: [phone]   Fax:  [phone]  Name: Tara Heath MRN: [id number] Date of Birth: 1970/06/08

## 2020-12-03 ENCOUNTER — Other Ambulatory Visit: Payer: Self-pay

## 2020-12-03 ENCOUNTER — Telehealth: Payer: Self-pay | Admitting: *Deleted

## 2020-12-03 ENCOUNTER — Ambulatory Visit (INDEPENDENT_AMBULATORY_CARE_PROVIDER_SITE_OTHER): Payer: Self-pay | Admitting: *Deleted

## 2020-12-03 VITALS — Ht 65.0 in | Wt 165.6 lb

## 2020-12-03 DIAGNOSIS — Z1211 Encounter for screening for malignant neoplasm of colon: Secondary | ICD-10-CM

## 2020-12-03 MED ORDER — NA SULFATE-K SULFATE-MG SULF 17.5-3.13-1.6 GM/177ML PO SOLN: 1.0000 | Freq: Once | ORAL | 0 refills | Status: AC

## 2020-12-03 NOTE — Patient Instructions (Addendum)
Tara Heath  May 30, 1970 MRN: [id number]     Procedure Date: 12/22/2020 Time to register: 10:30 am Place to register: [facility] [facility] Scheduled provider: Dr. Abbey Chatters    PREPARATION FOR COLONOSCOPY WITH SUPREP BOWEL PREP KIT  Note: Suprep Bowel Prep Kit is a split-dose (2day) regimen. Consumption of BOTH 6-ounce bottles is required for a complete prep.  Please notify us immediately if you are diabetic, take iron supplements, or if you are on Coumadin or any other blood thinners.  Please hold the following medications: n/a                                                                                                                                                  2 DAYS BEFORE PROCEDURE:  DATE: 12/20/2020   DAY: Sunday Begin clear liquid diet AFTER your lunch meal. NO SOLID FOODS after this point.  1 DAY BEFORE PROCEDURE:  DATE: 12/21/2020   DAY: Monday Continue clear liquids the entire day - NO SOLID FOOD.   Diabetic medications adjustments for today: n/a  At 6:00pm: Complete steps 1 through 4 below, using ONE (1) 6-ounce bottle, before going to bed. Step 1:  Pour ONE (1) 6-ounce bottle of SUPREP liquid into the mixing container.  Step 2:  Add cool drinking water to the 16 ounce line on the container and mix.  Note: Dilute the solution concentrate as directed prior to use. Step 3:  DRINK ALL the liquid in the container. Step 4:  You MUST drink an additional two (2) or more 16 ounce containers of water over the next one (1) hour.   Continue clear liquids.  DAY OF PROCEDURE:   DATE: 12/22/2020   DAY: Tuesday If you take medications for your heart, blood pressure, or breathing, you may take these medications.  Diabetic medications adjustments for today:  n/a  5 hours before your procedure at 7:00 am : Step 1:  Pour ONE (1) 6-ounce bottle of SUPREP liquid into the mixing container.  Step 2:  Add cool drinking water to the 16 ounce line on the container and mix.  Note: Dilute  the solution concentrate as directed prior to use. Step 3:  DRINK ALL the liquid in the container. Step 4:  You MUST drink an additional two (2) or more 16 ounce containers of water over the next one (1) hour. You MUST complete the final glass of water at least 3 hours before your colonoscopy. Nothing by mouth past 9:00 am.  You may take your morning medications with sip of water unless we have instructed otherwise.    Please see below for Dietary Information.  CLEAR LIQUIDS INCLUDE:  Water Jello (NOT red in color)   Ice Popsicles (NOT red in color)   Tea (sugar ok, no milk/cream) Powdered fruit flavored drinks  Coffee (sugar ok, no milk/cream) Gatorade/ Lemonade/  Kool-Aid  (NOT red in color)   Juice: apple, white grape, white cranberry Soft drinks  Clear bullion, consomme, broth (fat free beef/chicken/vegetable)  Carbonated beverages (any kind)  Strained chicken noodle soup Hard Candy   Remember: Clear liquids are liquids that will allow you to see your fingers on the other side of a clear glass. Be sure liquids are NOT red in color, and not cloudy, but CLEAR.  DO NOT EAT OR DRINK ANY OF THE FOLLOWING:  Dairy products of any kind   Cranberry juice Tomato juice / V8 juice   Grapefruit juice Orange juice     Red grape juice  Do not eat any solid foods, including such foods as: cereal, oatmeal, yogurt, fruits, vegetables, creamed soups, eggs, bread, crackers, pureed foods in a blender, etc.   HELPFUL HINTS FOR DRINKING PREP SOLUTION:  Make sure prep is extremely cold. Mix and refrigerate the the morning of the prep. You may also put in the freezer.  You may try mixing some Crystal Light or Country Time Lemonade if you prefer. Mix in small amounts; add more if necessary. Try drinking through a straw Rinse mouth with water or a mouthwash between glasses, to remove after-taste. Try sipping on a cold beverage /ice/ popsicles between glasses of prep. Place a piece of sugar-free hard candy  in mouth between glasses. If you become nauseated, try consuming smaller amounts, or stretch out the time between glasses. Stop for 30-60 minutes, then slowly start back drinking.     OTHER INSTRUCTIONS  You will need a responsible adult at least 50 years of age to accompany you and drive you home. This person must remain in the waiting room during your procedure. The hospital will cancel your procedure if you do not have a responsible adult with you.   Wear loose fitting clothing that is easily removed. Leave jewelry and other valuables at home.  Remove all body piercing jewelry and leave at home. Total time from sign-in until discharge is approximately 2-3 hours. You should go home directly after your procedure and rest. You can resume normal activities the day after your procedure. The day of your procedure you should not: Drive Make legal decisions Operate machinery Drink alcohol Return to work   You may call the office (Dept: [phone]) before 5:00pm, or page the doctor on call [phone]) after 5:00pm, for further instructions, if necessary.   Insurance Information YOU WILL NEED TO CHECK WITH YOUR INSURANCE COMPANY FOR THE BENEFITS OF COVERAGE YOU HAVE FOR THIS PROCEDURE.  UNFORTUNATELY, NOT ALL INSURANCE COMPANIES HAVE BENEFITS TO COVER ALL OR PART OF THESE TYPES OF PROCEDURES.  IT IS YOUR RESPONSIBILITY TO CHECK YOUR BENEFITS, HOWEVER, WE WILL BE GLAD TO ASSIST YOU WITH ANY CODES YOUR INSURANCE COMPANY MAY NEED.    PLEASE NOTE THAT MOST INSURANCE COMPANIES WILL NOT COVER A SCREENING COLONOSCOPY FOR PEOPLE UNDER THE AGE OF 50  IF YOU HAVE BCBS INSURANCE, YOU MAY HAVE BENEFITS FOR A SCREENING COLONOSCOPY BUT IF POLYPS ARE FOUND THE DIAGNOSIS WILL CHANGE AND THEN YOU MAY HAVE A DEDUCTIBLE THAT WILL NEED TO BE MET. SO PLEASE MAKE SURE YOU CHECK YOUR BENEFITS FOR A SCREENING COLONOSCOPY AS WELL AS A DIAGNOSTIC COLONOSCOPY.

## 2020-12-03 NOTE — Progress Notes (Signed)
Ok to schedule.  ASA II 

## 2020-12-03 NOTE — Progress Notes (Addendum)
Gastroenterology Pre-Procedure Review  Request Date: 12/03/2020 Requesting Physician: Wells Guiles, CNM @ [facility], no previous TCS  PATIENT REVIEW QUESTIONS: The patient responded to the following health history questions as indicated:    1. Diabetes Melitis: no 2. Joint replacements in the past 12 months: no 3. Major health problems in the past 3 months: no 4. Has an artificial valve or MVP: no 5. Has a defibrillator: no 6. Has been advised in past to take antibiotics in advance of a procedure like teeth cleaning: no 7. Family history of colon cancer: no  8. Alcohol Use: no 9. Illicit drug Use: no 10. History of sleep apnea: no  11. History of coronary artery or other vascular stents placed within the last 12 months: no 12. History of any prior anesthesia complications: no 13. Body mass index is 27.56 kg/m.    MEDICATIONS & ALLERGIES:    Patient reports the following regarding taking any blood thinners:   Plavix? no Aspirin? no Coumadin? no Brilinta? no Xarelto? no Eliquis? no Pradaxa? no Savaysa? no Effient? no  Patient confirms/reports the following medications:  Current Outpatient Medications  Medication Sig Dispense Refill   citalopram (CELEXA) 20 MG tablet Take 20 mg by mouth daily.     simvastatin (ZOCOR) 20 MG tablet Take 40 mg by mouth. Takes 40 mg daily.     No current facility-administered medications for this visit.    Patient confirms/reports the following allergies:  Allergies  Allergen Reactions   Aspirin Rash    No orders of the defined types were placed in this encounter.   AUTHORIZATION INFORMATION Primary Insurance: [employer],  ID #: [id number],  Group #: [id number] Pre-Cert / Auth required: No, not required  SCHEDULE INFORMATION: Procedure has been scheduled as follows:  Date: 12/29/2020, Time: 10:30  Location: [facility] with Dr. Abbey Chatters  This Gastroenterology Pre-Precedure Review Form is being routed to the following provider(s):  Neil Crouch, PA-C

## 2020-12-03 NOTE — Progress Notes (Signed)
Pt made aware that she will need urine pregnancy test on 12/25/2020 before her procedure.  She was given information and told by interpreter as well.

## 2020-12-03 NOTE — Telephone Encounter (Signed)
Pt had a triage visit today and needs to reschedule her procedure. [phone]

## 2020-12-04 ENCOUNTER — Other Ambulatory Visit: Payer: Self-pay | Admitting: *Deleted

## 2020-12-04 DIAGNOSIS — Z1211 Encounter for screening for malignant neoplasm of colon: Secondary | ICD-10-CM

## 2020-12-04 NOTE — Progress Notes (Signed)
Pt called and requested to reschedule her procedure due to work schedule.  Rescheduled her procedure to 12/22/2020 at 12:00 with arrival at 10:30.  Pt aware to go have labs drawn on 12/18/2020.  Faxing new instructions to pharmacy for pt to pick up with prep kit.  Pt voiced understanding.

## 2020-12-04 NOTE — Telephone Encounter (Signed)
Lmom for pt to call me back.  Also, left message with daughter (listed on DPR).

## 2020-12-17 ENCOUNTER — Other Ambulatory Visit: Payer: Self-pay

## 2020-12-17 ENCOUNTER — Encounter (HOSPITAL_COMMUNITY): Payer: Self-pay | Admitting: Occupational Therapy

## 2020-12-17 ENCOUNTER — Ambulatory Visit (HOSPITAL_COMMUNITY): Payer: 59 | Attending: Internal Medicine | Admitting: Occupational Therapy

## 2020-12-17 DIAGNOSIS — M25521 Pain in right elbow: Secondary | ICD-10-CM | POA: Diagnosis present

## 2020-12-17 DIAGNOSIS — M25522 Pain in left elbow: Secondary | ICD-10-CM | POA: Diagnosis present

## 2020-12-17 NOTE — Therapy (Signed)
[place], [place], [address] Phone: [phone]   Fax:  [phone]  Occupational Therapy Treatment  Patient Details  Name: Anjana Brandewie MRN: [id number] Date of Birth: 11/11/1970 Referring Provider (OT): Roberto Scales, MD   Encounter Date: 12/17/2020   OT End of Session - 12/17/20 1003     Visit Number 2    Number of Visits 2    Date for OT Re-Evaluation 12/22/20    Authorization Type [employer]    Authorization Time Period [place] $10, no authorization, visit limit: 30    Authorization - Visit Number 2    Authorization - Number of Visits 30    OT Start Time [phone]    OT Stop Time 0945    OT Time Calculation (min) 40 min    Activity Tolerance Patient tolerated treatment well    Behavior During Therapy [facility] for tasks assessed/performed             Past Medical History:  Diagnosis Date   Cervical polyp    Depression    Hyperlipidemia     History reviewed. No pertinent surgical history.  There were no vitals filed for this visit.   Subjective Assessment - 12/17/20 0905     Subjective  S: The exercises help.    Patient is accompanied by: Interpreter   Millie   Currently in Pain? Yes    Pain Score 5     Pain Location Hand    Pain Orientation Right    Pain Descriptors / Indicators Aching;Tingling    Pain Type Chronic pain    Pain Onset More than a month ago    Pain Frequency Constant    Aggravating Factors  worse in the mornings and with work tasks    Pain Relieving Factors extending elbows    Effect of Pain on Daily Activities severe effect on ADLs/work    Multiple Pain Sites Yes    Pain Score 5    Pain Location Hand    Pain Orientation Left    Pain Descriptors / Indicators Aching;Tingling    Pain Type Chronic pain    Pain Onset More than a month ago    Pain Frequency Constant    Aggravating Factors  worse when waking up in the mornnings and with work tasks    Pain Relieving Factors  extending elbows    Effect of Pain on Daily Activities severe effect on ADLs/work                [facility] OT Assessment - 12/17/20 0904       Assessment   Medical Diagnosis bilateral hand numbness/pain      Precautions   Precautions None                      OT Treatments/Exercises (OP) - 12/17/20 [id number]       Exercises   Exercises Elbow      Splinting   Splinting bilateral elbow splints fabricated for nighttime use. Splints fabricated with elbows in ~20-25 degrees of extension. Corners rounded and sides smoothed out, no sharp points noted and pt reports comfort. Straps applied to proximal and distal ends of the splints, splints labeled for appropriate fit when pt donning                      OT Short Term Goals - 12/17/20 1007       OT SHORT TERM GOAL #  1   Title Patient will be educated and verbalize understanding of HEP and education provided in order to decrease the intensity/occurance of numbness and pain that is experienced bilaterally.    Time 4    Period Weeks    Status Achieved    Target Date 12/22/20      OT SHORT TERM GOAL #2   Title Patient will be provided with appropriate nightime elbow splints while either verbalizing or understanding donning/doffing technique, wearing schedule, precautions, and care.    Time 4    Period Weeks    Status Achieved                      Plan - 12/17/20 1003     Clinical Impression Statement A: Pt reports HEP helps with the tingling and pain she is feeling at work, continues to wake up with pain as she finds herself sleeping with elbows bent despite going to sleep with elbows straight. Bilateral elbow extension splints fabricated today for nighttime use to decrease pain and tingling. Splints fabricated at approximately 20-25 degrees extension, strapping applied at proximal and distal ends of the splints. Pt verbalized understanding of splint wear/care/cleaning education.    Body Structure /  Function / Physical Skills ADL;UE functional use;Pain;IADL    Plan P: Follow up on splints and adjust if needed. Discharge if no adjustments are needed    OT Home Exercise Plan eval: ulnar nerve glides; 8/4: splint education on wear/care/washing    Consulted and Agree with Plan of Care Patient             Patient will benefit from skilled therapeutic intervention in order to improve the following deficits and impairments:   Body Structure / Function / Physical Skills: ADL, UE functional use, Pain, IADL       Visit Diagnosis: Pain in left elbow  Pain in right elbow    Problem List Patient Active Problem List   Diagnosis Date Noted   Cervical polyp 10/07/2020    Guadelupe Sabin, OTR/L  [phone] 12/17/2020, 10:08 AM  [place], [place], [address] Phone: [phone]   Fax:  [phone]  Name: Ya Rizzotti MRN: [id number] Date of Birth: [birth year]

## 2020-12-18 ENCOUNTER — Other Ambulatory Visit (HOSPITAL_COMMUNITY)
Admission: RE | Admit: 2020-12-18 | Discharge: 2020-12-18 | Disposition: A | Discharge status: Home / Self Care | Payer: 59 | Source: Ambulatory Visit | Attending: Internal Medicine | Admitting: Internal Medicine

## 2020-12-18 DIAGNOSIS — Z1211 Encounter for screening for malignant neoplasm of colon: Secondary | ICD-10-CM | POA: Insufficient documentation

## 2020-12-18 LAB — PREGNANCY, URINE: Preg Test, Ur: NEGATIVE

## 2020-12-22 ENCOUNTER — Encounter (HOSPITAL_COMMUNITY)
Admission: RE | Disposition: A | Discharge status: Home / Self Care | Payer: Self-pay | Source: Home / Self Care | Attending: Internal Medicine

## 2020-12-22 ENCOUNTER — Encounter (HOSPITAL_COMMUNITY): Payer: Self-pay

## 2020-12-22 ENCOUNTER — Ambulatory Visit (HOSPITAL_COMMUNITY): Payer: 59 | Admitting: Anesthesiology

## 2020-12-22 ENCOUNTER — Ambulatory Visit (HOSPITAL_COMMUNITY)
Admission: RE | Admit: 2020-12-22 | Discharge: 2020-12-22 | Disposition: A | Discharge status: Home / Self Care | Payer: 59 | Attending: Internal Medicine | Admitting: Internal Medicine

## 2020-12-22 ENCOUNTER — Other Ambulatory Visit: Payer: Self-pay

## 2020-12-22 DIAGNOSIS — Z1211 Encounter for screening for malignant neoplasm of colon: Secondary | ICD-10-CM

## 2020-12-22 DIAGNOSIS — Z886 Allergy status to analgesic agent status: Secondary | ICD-10-CM | POA: Diagnosis not present

## 2020-12-22 DIAGNOSIS — Z79899 Other long term (current) drug therapy: Secondary | ICD-10-CM | POA: Diagnosis not present

## 2020-12-22 HISTORY — PX: COLONOSCOPY WITH PROPOFOL: SHX5780

## 2020-12-22 SURGERY — COLONOSCOPY WITH PROPOFOL
Anesthesia: General

## 2020-12-22 MED ORDER — PROPOFOL 500 MG/50ML IV EMUL
INTRAVENOUS | Status: DC | PRN
  Administered 2020-12-22: 150 ug/kg/min via INTRAVENOUS

## 2020-12-22 MED ORDER — PROPOFOL 10 MG/ML IV BOLUS
INTRAVENOUS | Status: DC | PRN
  Administered 2020-12-22: 100 mg via INTRAVENOUS

## 2020-12-22 MED ORDER — LACTATED RINGERS IV SOLN: INTRAVENOUS | Status: DC

## 2020-12-22 NOTE — Anesthesia Postprocedure Evaluation (Signed)
Anesthesia Post Note  Patient: Tara Heath  Procedure(s) Performed: COLONOSCOPY WITH PROPOFOL  Patient location during evaluation: PACU Anesthesia Type: General Level of consciousness: awake and alert Pain management: pain level controlled Vital Signs Assessment: post-procedure vital signs reviewed and stable Respiratory status: spontaneous breathing, nonlabored ventilation, respiratory function stable and patient connected to nasal cannula oxygen Cardiovascular status: blood pressure returned to baseline and stable Postop Assessment: no apparent nausea or vomiting Anesthetic complications: no   No notable events documented.   Last Vitals:  Vitals:   12/22/20 1147 12/22/20 1156  BP: (!) 86/50 (!) 91/51  Pulse:    Resp: 15   Temp: 36.9 C   SpO2: 100%     Last Pain:  Vitals:   12/22/20 1147  TempSrc: Axillary  PainSc: 0-No pain                 Nicanor Alcon

## 2020-12-22 NOTE — H&P (Signed)
Primary Care Physician:  [facility] Primary Gastroenterologist:  Dr. Abbey Chatters  Pre-Procedure History & Physical: HPI:  Tara Heath is a 50 y.o. female is here for a colonoscopy for colon cancer screening purposes.  Patient denies any family history of colorectal cancer.  No melena or hematochezia.  No abdominal pain or unintentional weight loss.  No change in bowel habits.  Overall feels well from a GI standpoint.  Past Medical History:  Diagnosis Date   Cervical polyp    Depression    Hyperlipidemia     History reviewed. No pertinent surgical history.  Prior to Admission medications   Medication Sig Start Date End Date Taking? Authorizing Provider  albuterol (VENTOLIN HFA) 108 (90 Base) MCG/ACT inhaler Inhale 2 puffs into the lungs every 6 (six) hours as needed for wheezing or shortness of breath.   Yes [provider]  cholecalciferol (VITAMIN D3) 25 MCG (1000 UNIT) tablet Take 1,000 Units by mouth daily.   Yes [provider]  citalopram (CELEXA) 20 MG tablet Take 20 mg by mouth daily. 08/25/20  Yes [provider]  Dextran 70-Hypromellose (ARTIFICIAL TEARS) 0.1-0.3 % SOLN Place 1 drop into both eyes daily as needed (dry eyes).   Yes [provider]  Multiple Vitamins-Minerals (MULTIVITAMIN WOMEN PO) Take 1 tablet by mouth daily.   Yes [provider]  simvastatin (ZOCOR) 40 MG tablet Take 40 mg by mouth daily. Takes 40 mg daily.   Yes [provider]    Allergies as of 12/04/2020 - Review Complete 12/03/2020  Allergen Reaction Noted   Aspirin Rash 01/16/2015    Family History  Problem Relation Age of Onset   Hypertension Mother    Hyperlipidemia Mother    Breast cancer Mother    Cancer Father        brain   Breast cancer Sister    Breast cancer Maternal Aunt     Social History   Socioeconomic History   Marital status: Married    Spouse name: Not on file   Number of children: Not on file   Years  of education: Not on file   Highest education level: Not on file  Occupational History   Not on file  Tobacco Use   Smoking status: Never   Smokeless tobacco: Never  Vaping Use   Vaping Use: Never used  Substance and Sexual Activity   Alcohol use: Not Currently   Drug use: Not Currently   Sexual activity: Not Currently    Birth control/protection: None  Other Topics Concern   Not on file  Social History Narrative   Not on file   Social Determinants of Health   Financial Resource Strain: Low Risk    Difficulty of Paying Living Expenses: Not very hard  Food Insecurity: No Food Insecurity   Worried About Running Out of Food in the Last Year: Never true   [place] in the Last Year: Never true  Transportation Needs: No Transportation Needs   Lack of Transportation (Medical): No   Lack of Transportation (Non-Medical): No  Physical Activity: Inactive   Days of Exercise per Week: 0 days   Minutes of Exercise per Session: 0 min  Stress: No Stress Concern Present   Feeling of Stress : Only a little  Social Connections: Moderately Isolated   Frequency of Communication with Friends and Family: More than three times a week   Frequency of Social Gatherings with Friends and Family: Twice a week   Attends Religious  Services: More than 4 times per year   Active Member of Clubs or Organizations: No   Attends Archivist Meetings: Never   Marital Status: Separated  Intimate Partner Violence: Not At Risk   Fear of Current or Ex-Partner: No   Emotionally Abused: No   Physically Abused: No   Sexually Abused: No    Review of Systems: See HPI, otherwise negative ROS  Physical Exam: Vital signs in last 24 hours: Temp:  [98 F (36.7 C)] 98 F (36.7 C) (08/09 1027) Pulse Rate:  [69] 69 (08/09 1027) Resp:  [12] 12 (08/09 1027) BP: (107)/(59) 107/59 (08/09 1027) SpO2:  [100 %] 100 % (08/09 1027) Weight:  [77.1 kg] 77.1 kg (08/09 1016)   General:   Alert,   Well-developed, well-nourished, pleasant and cooperative in NAD Head:  Normocephalic and atraumatic. Eyes:  Sclera clear, no icterus.   Conjunctiva pink. Ears:  Normal auditory acuity. Nose:  No deformity, discharge,  or lesions. Mouth:  No deformity or lesions, dentition normal. Neck:  Supple; no masses or thyromegaly. Lungs:  Clear throughout to auscultation.   No wheezes, crackles, or rhonchi. No acute distress. Heart:  Regular rate and rhythm; no murmurs, clicks, rubs,  or gallops. Abdomen:  Soft, nontender and nondistended. No masses, hepatosplenomegaly or hernias noted. Normal bowel sounds, without guarding, and without rebound.   Msk:  Symmetrical without gross deformities. Normal posture. Extremities:  Without clubbing or edema. Neurologic:  Alert and  oriented x4;  grossly normal neurologically. Skin:  Intact without significant lesions or rashes. Cervical Nodes:  No significant cervical adenopathy. Psych:  Alert and cooperative. Normal mood and affect.  Impression/Plan: Tara Heath is here for a colonoscopy to be performed for colon cancer screening purposes.  The risks of the procedure including infection, bleed, or perforation as well as benefits, limitations, alternatives and imponderables have been reviewed with the patient. Questions have been answered. All parties agreeable.

## 2020-12-22 NOTE — Discharge Instructions (Addendum)

## 2020-12-22 NOTE — Op Note (Signed)
[facility] Patient Name: Tara Heath Procedure Date: 12/22/2020 11:26 AM MRN: [id number] Date of Birth: [birth year] Attending MD: Elon Alas. Abbey Chatters DO CSN: [id number] Age: 50 Admit Type: Outpatient Procedure:                Colonoscopy Indications:              Screening for colorectal malignant neoplasm Providers:                Elon Alas. Abbey Chatters, DO, Janeece Riggers, RN, Randa Spike, Technician Referring MD:              Medicines:                See the Anesthesia note for documentation of the                            administered medications Complications:            No immediate complications. Estimated Blood Loss:     Estimated blood loss: none. Procedure:                Pre-Anesthesia Assessment:                           - The anesthesia plan was to use monitored                            anesthesia care (MAC).                           After obtaining informed consent, the colonoscope                            was passed under direct vision. Throughout the                            procedure, the patient's blood pressure, pulse, and                            oxygen saturations were monitored continuously. The                            [id number]-HQ190L ([id number]) scope was introduced through                            the anus and advanced to the the cecum, identified                            by appendiceal orifice and ileocecal valve. The                            colonoscopy was performed without difficulty. The                            patient tolerated the procedure well. The quality  of the bowel preparation was evaluated using the                            BBPS [facility] Bowel Preparation Scale) with scores                            of: Right Colon = 3, Transverse Colon = 3 and Left                            Colon = 3 (entire mucosa seen well with no residual                            staining, small  fragments of stool or opaque                            liquid). The total BBPS score equals 9. Scope In: 11:34:15 AM Scope Out: 11:45:02 AM Scope Withdrawal Time: 0 hours 6 minutes 38 seconds  Total Procedure Duration: 0 hours 10 minutes 47 seconds  Findings:      The perianal and digital rectal examinations were normal.      The colon (entire examined portion) appeared normal. Impression:               - The entire examined colon is normal.                           - No specimens collected. Moderate Sedation:      Per Anesthesia Care Recommendation:           - Patient has a contact number available for                            emergencies. The signs and symptoms of potential                            delayed complications were discussed with the                            patient. Return to normal activities tomorrow.                            Written discharge instructions were provided to the                            patient.                           - Resume previous diet.                           - Continue present medications.                           - Repeat colonoscopy in 10 years for screening  purposes.                           - Return to GI clinic PRN. Procedure Code(s):        --- Professional ---                           I3474, Colorectal cancer screening; colonoscopy on                            individual not meeting criteria for high risk Diagnosis Code(s):        --- Professional ---                           Z12.11, Encounter for screening for malignant                            neoplasm of colon CPT copyright 2019 American Medical Association. All rights reserved. The codes documented in this report are preliminary and upon coder review may  be revised to meet current compliance requirements. Elon Alas. Abbey Chatters, DO [place] Abbey Chatters, DO 12/22/2020 11:46:42 AM This report has been signed electronically. Number of Addenda:  0

## 2020-12-22 NOTE — Transfer of Care (Signed)
Immediate Anesthesia Transfer of Care Note  Patient: Tara Heath  Procedure(s) Performed: COLONOSCOPY WITH PROPOFOL  Patient Location: Endoscopy Unit  Anesthesia Type:General  Level of Consciousness: awake  Airway & Oxygen Therapy: Patient Spontanous Breathing  Post-op Assessment: Report given to RN and Post -op Vital signs reviewed and stable  Post vital signs: Reviewed and stable  Last Vitals:  Vitals Value Taken Time  BP    Temp    Pulse    Resp    SpO2      Last Pain:  Vitals:   12/22/20 1027  TempSrc: Oral  PainSc:       Patients Stated Pain Goal: 10 ([phone] [id number])  Complications: No notable events documented.

## 2020-12-22 NOTE — Anesthesia Preprocedure Evaluation (Signed)
Anesthesia Evaluation  Patient identified by MRN, date of birth, ID band Patient awake    Reviewed: Allergy & Precautions, NPO status , Patient's Chart, lab work & pertinent test results  Airway Mallampati: I  TM Distance: >3 FB Neck ROM: Full    Dental no notable dental hx.    Pulmonary neg pulmonary ROS,    Pulmonary exam normal breath sounds clear to auscultation       Cardiovascular negative cardio ROS Normal cardiovascular exam Rhythm:Regular Rate:Normal     Neuro/Psych Depression negative neurological ROS     GI/Hepatic negative GI ROS, Neg liver ROS,   Endo/Other  negative endocrine ROS  Renal/GU negative Renal ROS  negative genitourinary   Musculoskeletal negative musculoskeletal ROS (+)   Abdominal   Peds negative pediatric ROS (+)  Hematology negative hematology ROS (+)   Anesthesia Other Findings   Reproductive/Obstetrics negative OB ROS                             Anesthesia Physical Anesthesia Plan  ASA: 2  Anesthesia Plan: General   Post-op Pain Management:    Induction: Intravenous  PONV Risk Score and Plan: TIVA  Airway Management Planned: Nasal Cannula, Natural Airway and Simple Face Mask  Additional Equipment:   Intra-op Plan:   Post-operative Plan:   Informed Consent: I have reviewed the patients History and Physical, chart, labs and discussed the procedure including the risks, benefits and alternatives for the proposed anesthesia with the patient or authorized representative who has indicated his/her understanding and acceptance.       Plan Discussed with: CRNA  Anesthesia Plan Comments:         Anesthesia Quick Evaluation

## 2020-12-30 ENCOUNTER — Encounter (HOSPITAL_COMMUNITY): Payer: Self-pay | Admitting: Internal Medicine

## 2021-04-05 ENCOUNTER — Other Ambulatory Visit (HOSPITAL_COMMUNITY): Payer: Self-pay | Admitting: Internal Medicine

## 2021-04-05 DIAGNOSIS — Z1231 Encounter for screening mammogram for malignant neoplasm of breast: Secondary | ICD-10-CM

## 2021-04-19 ENCOUNTER — Ambulatory Visit (HOSPITAL_COMMUNITY)
Admission: RE | Admit: 2021-04-19 | Discharge: 2021-04-19 | Disposition: A | Discharge status: Home / Self Care | Payer: 59 | Source: Ambulatory Visit | Attending: Internal Medicine | Admitting: Internal Medicine

## 2021-04-19 ENCOUNTER — Other Ambulatory Visit: Payer: Self-pay

## 2021-04-19 DIAGNOSIS — Z1231 Encounter for screening mammogram for malignant neoplasm of breast: Secondary | ICD-10-CM | POA: Diagnosis present

## 2021-11-08 ENCOUNTER — Other Ambulatory Visit (HOSPITAL_COMMUNITY): Payer: Self-pay | Admitting: Internal Medicine

## 2021-11-08 DIAGNOSIS — Z1231 Encounter for screening mammogram for malignant neoplasm of breast: Secondary | ICD-10-CM

## 2022-02-15 ENCOUNTER — Other Ambulatory Visit (HOSPITAL_COMMUNITY)
Admission: RE | Admit: 2022-02-15 | Discharge: 2022-02-15 | Disposition: A | Discharge status: Home / Self Care | Payer: 59 | Source: Ambulatory Visit | Attending: Obstetrics & Gynecology | Admitting: Obstetrics & Gynecology

## 2022-02-15 ENCOUNTER — Encounter: Payer: Self-pay | Admitting: Obstetrics & Gynecology

## 2022-02-15 ENCOUNTER — Ambulatory Visit (INDEPENDENT_AMBULATORY_CARE_PROVIDER_SITE_OTHER): Payer: 59 | Admitting: Obstetrics & Gynecology

## 2022-02-15 VITALS — BP 120/78 | HR 70 | Ht 62.0 in | Wt 175.0 lb

## 2022-02-15 DIAGNOSIS — Z01419 Encounter for gynecological examination (general) (routine) without abnormal findings: Secondary | ICD-10-CM | POA: Diagnosis not present

## 2022-02-15 DIAGNOSIS — N841 Polyp of cervix uteri: Secondary | ICD-10-CM | POA: Diagnosis not present

## 2022-02-15 NOTE — Addendum Note (Signed)
Addended by: Janece Canterbury on: 02/15/2022 04:16 PM   Modules accepted: Orders

## 2022-02-15 NOTE — Progress Notes (Signed)
Subjective:     Tara Heath is a 51 y.o. female here for a routine exam.  No LMP recorded. Patient is perimenopausal. [id number] Birth Control Method:  abstinence Menstrual Calendar(currently): none since June, no menopausal symptoms  Current complaints: none.   Current acute medical issues:  none   Recent Gynecologic History No LMP recorded. Patient is perimenopausal. Last Pap: 5/22,  normal Last mammogram: 12/22,  normal  Past Medical History:  Diagnosis Date   Cervical polyp    Depression    Hyperlipidemia     Past Surgical History:  Procedure Laterality Date   COLONOSCOPY WITH PROPOFOL N/A 12/22/2020   Procedure: COLONOSCOPY WITH PROPOFOL;  Surgeon: Eloise Harman, DO;  [facility]: [facility];  Service: Endoscopy;  Laterality: N/A;  ASA II / 12:00    OB History     Gravida  3   Para  3   Term  3   Preterm      AB      Living  3      SAB      IAB      Ectopic      Multiple      Live Births              Social History   Socioeconomic History   Marital status: Married    Spouse name: Not on file   Number of children: Not on file   Years of education: Not on file   Highest education level: Not on file  Occupational History   Not on file  Tobacco Use   Smoking status: Never   Smokeless tobacco: Never  Vaping Use   Vaping Use: Never used  Substance and Sexual Activity   Alcohol use: Not Currently   Drug use: Not Currently   Sexual activity: Not Currently    Birth control/protection: None  Other Topics Concern   Not on file  Social History Narrative   Not on file   Social Determinants of Health   Financial Resource Strain: Low Risk  (10/07/2020)   Overall Financial Resource Strain (CARDIA)    Difficulty of Paying Living Expenses: Not very hard  Food Insecurity: No Food Insecurity (10/07/2020)   Hunger Vital Sign    Worried About Running Out of Food in the Last Year: Never true    Ran Out of Food in the Last Year: Never true   Transportation Needs: No Transportation Needs (10/07/2020)   PRAPARE - Hydrologist (Medical): No    Lack of Transportation (Non-Medical): No  Physical Activity: Inactive (10/07/2020)   Exercise Vital Sign    Days of Exercise per Week: 0 days    Minutes of Exercise per Session: 0 min  Stress: No Stress Concern Present (10/07/2020)   [place]    Feeling of Stress : Only a little  Social Connections: Moderately Isolated (10/07/2020)   Social Connection and Isolation Panel [NHANES]    Frequency of Communication with Friends and Family: More than three times a week    Frequency of Social Gatherings with Friends and Family: Twice a week    Attends Religious Services: More than 4 times per year    Active Member of [employer] or Organizations: No    Attends Archivist Meetings: Never    Marital Status: Separated    Family History  Problem Relation Age of Onset   Hypertension Mother    Hyperlipidemia Mother  Breast cancer Mother    Cancer Father        brain   Breast cancer Sister    Breast cancer Maternal Aunt      Current Outpatient Medications:    cholecalciferol (VITAMIN D3) 25 MCG (1000 UNIT) tablet, Take 1,000 Units by mouth daily., Disp: , Rfl:    citalopram (CELEXA) 20 MG tablet, Take 20 mg by mouth daily., Disp: , Rfl:    Dextran 70-Hypromellose (ARTIFICIAL TEARS) 0.1-0.3 % SOLN, Place 1 drop into both eyes daily as needed (dry eyes)., Disp: , Rfl:    Multiple Vitamins-Minerals (MULTIVITAMIN WOMEN PO), Take 1 tablet by mouth daily., Disp: , Rfl:    simvastatin (ZOCOR) 40 MG tablet, Take 40 mg by mouth daily. Takes 40 mg daily., Disp: , Rfl:   Review of Systems  Review of Systems  Constitutional: Negative for fever, chills, weight loss, malaise/fatigue and diaphoresis.  HENT: Negative for hearing loss, ear pain, nosebleeds, congestion, sore throat, neck pain, tinnitus and  ear discharge.   Eyes: Negative for blurred vision, double vision, photophobia, pain, discharge and redness.  Respiratory: Negative for cough, hemoptysis, sputum production, shortness of breath, wheezing and stridor.   Cardiovascular: Negative for chest pain, palpitations, orthopnea, claudication, leg swelling and PND.  Gastrointestinal: negative for abdominal pain. Negative for heartburn, nausea, vomiting, diarrhea, constipation, blood in stool and melena.  Genitourinary: Negative for dysuria, urgency, frequency, hematuria and flank pain.  Musculoskeletal: Negative for myalgias, back pain, joint pain and falls.  Skin: Negative for itching and rash.  Neurological: Negative for dizziness, tingling, tremors, sensory change, speech change, focal weakness, seizures, loss of consciousness, weakness and headaches.  Endo/Heme/Allergies: Negative for environmental allergies and polydipsia. Does not bruise/bleed easily.  Psychiatric/Behavioral: Negative for depression, suicidal ideas, hallucinations, memory loss and substance abuse. The patient is not nervous/anxious and does not have insomnia.        Objective:  Blood pressure 120/78, pulse 70, height '5\' 2"'$  (1.575 m), weight 175 lb (79.4 kg).   Physical Exam  Vitals reviewed. Constitutional: She is oriented to person, place, and time. She appears well-developed and well-nourished.  HENT:  Head: Normocephalic and atraumatic.        Right Ear: External ear normal.  Left Ear: External ear normal.  Nose: Nose normal.  Mouth/Throat: Oropharynx is clear and moist.  Eyes: Conjunctivae and EOM are normal. Pupils are equal, round, and reactive to light. Right eye exhibits no discharge. Left eye exhibits no discharge. No scleral icterus.  Neck: Normal range of motion. Neck supple. No tracheal deviation present. No thyromegaly present.  Cardiovascular: Normal rate, regular rhythm, normal heart sounds and intact distal pulses.  Exam reveals no gallop and no  friction rub.   No murmur heard. Respiratory: Effort normal and breath sounds normal. No respiratory distress. She has no wheezes. She has no rales. She exhibits no tenderness.  GI: Soft. Bowel sounds are normal. She exhibits no distension and no mass. There is no tenderness. There is no rebound and no guarding.  Genitourinary:  Breasts no masses skin changes or nipple changes bilaterally      Vulva is normal without lesions Vagina is pink moist without discharge Cervix normal in appearance and pap is not done, 3 small endocervical polyps noted and removed with cervical biospy forceps Uterus is normal size shape and contour Adnexa is negative with normal sized ovaries   Musculoskeletal: Normal range of motion. She exhibits no edema and no tenderness.  Neurological: She is alert and oriented  to person, place, and time. She has normal reflexes. She displays normal reflexes. No cranial nerve deficit. She exhibits normal muscle tone. Coordination normal.  Skin: Skin is warm and dry. No rash noted. No erythema. No pallor.  Psychiatric: She has a normal mood and affect. Her behavior is normal. Judgment and thought content normal.       Medications Ordered at today's visit: No orders of the defined types were placed in this encounter.   Other orders placed at today's visit: No orders of the defined types were placed in this encounter.     Assessment:    Normal Gyn exam.      ICD-10-CM   1. Well woman exam with routine gynecological exam  Z01.419     2. Endocervical polyp x 3  N84.1    removed including stalk and Monsel's placed      Plan:         Return in about 1 year (around 02/16/2023) for Follow up, with Dr Elonda Husky.

## 2022-02-17 LAB — SURGICAL PATHOLOGY

## 2022-04-21 ENCOUNTER — Ambulatory Visit (HOSPITAL_COMMUNITY): Payer: Self-pay

## 2022-04-25 ENCOUNTER — Ambulatory Visit (HOSPITAL_COMMUNITY)
Admission: RE | Admit: 2022-04-25 | Discharge: 2022-04-25 | Disposition: A | Discharge status: Home / Self Care | Payer: 59 | Source: Ambulatory Visit | Attending: Internal Medicine | Admitting: Internal Medicine

## 2022-04-25 DIAGNOSIS — Z1231 Encounter for screening mammogram for malignant neoplasm of breast: Secondary | ICD-10-CM | POA: Diagnosis not present

## 2022-08-03 ENCOUNTER — Other Ambulatory Visit: Payer: Self-pay | Admitting: Student

## 2022-08-03 DIAGNOSIS — T82590A Other mechanical complication of surgically created arteriovenous fistula, initial encounter: Secondary | ICD-10-CM

## 2023-03-14 ENCOUNTER — Other Ambulatory Visit (HOSPITAL_COMMUNITY): Payer: Self-pay | Admitting: Obstetrics & Gynecology

## 2023-03-14 DIAGNOSIS — Z1231 Encounter for screening mammogram for malignant neoplasm of breast: Secondary | ICD-10-CM

## 2023-03-15 ENCOUNTER — Other Ambulatory Visit (HOSPITAL_COMMUNITY): Payer: Self-pay | Admitting: Adult Health

## 2023-03-15 ENCOUNTER — Encounter: Payer: Self-pay | Admitting: Adult Health

## 2023-03-15 ENCOUNTER — Ambulatory Visit: Payer: 59 | Admitting: Adult Health

## 2023-03-15 VITALS — BP 132/81 | HR 70 | Ht 63.0 in | Wt 176.0 lb

## 2023-03-15 DIAGNOSIS — N644 Mastodynia: Secondary | ICD-10-CM | POA: Diagnosis not present

## 2023-03-15 DIAGNOSIS — N63 Unspecified lump in unspecified breast: Secondary | ICD-10-CM

## 2023-03-15 DIAGNOSIS — N6311 Unspecified lump in the right breast, upper outer quadrant: Secondary | ICD-10-CM

## 2023-03-15 NOTE — Progress Notes (Addendum)
  Subjective:     Patient ID: Tara Heath, female   DOB: [birth year], 52 y.o.   MRN: [id number]  HPI Tara Heath is a 52 year old Hispanic female, married, [id number], in complaining of pain in right breast for about 2 weeks, no known injury.  Has interpreter with her.      Component Value Date/Time   DIAGPAP  10/07/2020 0916    - Negative for intraepithelial lesion or malignancy (NILM)   HPVHIGH Negative 10/07/2020 0916   ADEQPAP  10/07/2020 0916    Satisfactory for evaluation; transformation zone component PRESENT.    Review of Systems +pain in right breast for 2 weeks No known injury No mass or nipple discharge noted Reviewed past medical,surgical, social and family history. Reviewed medications and allergies.     Objective:   Physical Exam BP 132/81 (BP Location: Left Arm, Patient Position: Sitting, Cuff Size: Normal)   Pulse 70   Ht 5\' 3"  (1.6 m)   Wt 176 lb (79.8 kg)   BMI 31.18 kg/m      Skin warm and dry,  Breasts:no dominate palpable mass, retraction or nipple discharge on the left, on the right no retraction or nipple discharge, has pea sized mass at 11 0'clock 3 FB from areola that is tender and non mobile.  Fall risk is low  Upstream - 03/15/23 1617       Pregnancy Intention Screening   Does the patient want to become pregnant in the next year? No    Does the patient's partner want to become pregnant in the next year? No    Would the patient like to discuss contraceptive options today? No      Contraception Wrap Up   Current Method Abstinence    End Method Abstinence    Contraception Counseling Provided No             Assessment:     1. Breast pain, right +pain in right breast for 2 weeks  Has appt for 03/21/23 at 9 am at [facility]  - [place] LIMITED ULTRASOUND INCLUDING AXILLA RIGHT BREAST; Future And right breast diagnotic mammogram   2. Mass of upper outer quadrant of right breast Has pea sized mass at 11 o'clock,tender and non mobile Has appt for  03/21/23 at [facility]  - [place] LIMITED ULTRASOUND INCLUDING AXILLA RIGHT BREAST; Future    And right breast diagnostic mammogram   Plan:     Follow up prn

## 2023-03-21 ENCOUNTER — Encounter (HOSPITAL_COMMUNITY): Payer: Self-pay

## 2023-03-21 ENCOUNTER — Other Ambulatory Visit (HOSPITAL_COMMUNITY): Payer: Self-pay | Admitting: Adult Health

## 2023-03-21 ENCOUNTER — Ambulatory Visit (HOSPITAL_COMMUNITY)
Admission: RE | Admit: 2023-03-21 | Discharge: 2023-03-21 | Disposition: A | Discharge status: Home / Self Care | Payer: 59 | Source: Ambulatory Visit | Attending: Adult Health | Admitting: Adult Health

## 2023-03-21 DIAGNOSIS — N6311 Unspecified lump in the right breast, upper outer quadrant: Secondary | ICD-10-CM | POA: Insufficient documentation

## 2023-03-21 DIAGNOSIS — N644 Mastodynia: Secondary | ICD-10-CM

## 2023-03-21 DIAGNOSIS — N63 Unspecified lump in unspecified breast: Secondary | ICD-10-CM | POA: Diagnosis present

## 2023-04-18 ENCOUNTER — Encounter: Payer: Self-pay | Admitting: Obstetrics & Gynecology

## 2023-04-18 ENCOUNTER — Ambulatory Visit (INDEPENDENT_AMBULATORY_CARE_PROVIDER_SITE_OTHER): Payer: 59 | Admitting: Obstetrics & Gynecology

## 2023-04-18 VITALS — BP 122/63 | HR 73 | Ht 62.0 in | Wt 174.0 lb

## 2023-04-18 DIAGNOSIS — Z01419 Encounter for gynecological examination (general) (routine) without abnormal findings: Secondary | ICD-10-CM

## 2023-04-18 NOTE — Progress Notes (Signed)
Subjective:     Tara Heath is a 52 y.o. female here for a routine exam.  Patient's last menstrual period was 03/31/2023. [id number] Birth Control Method:  abstinence Menstrual Calendar(currently): irregular  Current complaints: none.   Current acute medical issues:  none   Recent Gynecologic History Patient's last menstrual period was 03/31/2023. Last Pap: 2022,  normal Last mammogram: 03/21/23,  normal  Past Medical History:  Diagnosis Date   Cervical polyp    Depression    Hyperlipidemia     Past Surgical History:  Procedure Laterality Date   COLONOSCOPY WITH PROPOFOL N/A 12/22/2020   Procedure: COLONOSCOPY WITH PROPOFOL;  Surgeon: Lanelle Bal, DO;  Location: [facility];  Service: Endoscopy;  Laterality: N/A;  ASA II / 12:00    OB History     Gravida  3   Para  3   Term  3   Preterm      AB      Living  3      SAB      IAB      Ectopic      Multiple      Live Births              Social History   Socioeconomic History   Marital status: Married    Spouse name: Not on file   Number of children: Not on file   Years of education: Not on file   Highest education level: Not on file  Occupational History   Not on file  Tobacco Use   Smoking status: Never   Smokeless tobacco: Never  Vaping Use   Vaping status: Never Used  Substance and Sexual Activity   Alcohol use: Not Currently   Drug use: Not Currently   Sexual activity: Not Currently    Birth control/protection: None, Abstinence  Other Topics Concern   Not on file  Social History Narrative   Not on file   Social Determinants of Health   Financial Resource Strain: Low Risk  (10/07/2020)   Overall Financial Resource Strain (CARDIA)    Difficulty of Paying Living Expenses: Not very hard  Food Insecurity: No Food Insecurity (04/18/2023)   Hunger Vital Sign    Worried About Running Out of Food in the Last Year: Never true    Ran Out of Food in the Last Year: Never true   Transportation Needs: No Transportation Needs (04/18/2023)   PRAPARE - Administrator, Civil Service (Medical): No    Lack of Transportation (Non-Medical): No  Physical Activity: Inactive (04/18/2023)   Exercise Vital Sign    Days of Exercise per Week: 0 days    Minutes of Exercise per Session: 0 min  Stress: No Stress Concern Present (04/18/2023)   [employer] of Occupational Health - Occupational Stress Questionnaire    Feeling of Stress : Only a little  Social Connections: Moderately Isolated (04/18/2023)   Social Connection and Isolation Panel [NHANES]    Frequency of Communication with Friends and Family: More than three times a week    Frequency of Social Gatherings with Friends and Family: Once a week    Attends Religious Services: More than 4 times per year    Active Member of [employer] or Organizations: No    Attends Banker Meetings: Never    Marital Status: Separated    Family History  Problem Relation Age of Onset   Hypertension Mother    Hyperlipidemia Mother    Cancer  Father        brain   Breast cancer Maternal Aunt      Current Outpatient Medications:    cholecalciferol (VITAMIN D3) 25 MCG (1000 UNIT) tablet, Take 1,000 Units by mouth daily., Disp: , Rfl:    citalopram (CELEXA) 40 MG tablet, Take 40 mg by mouth daily., Disp: , Rfl:    Dextran 70-Hypromellose (ARTIFICIAL TEARS) 0.1-0.3 % SOLN, Place 1 drop into both eyes daily as needed (dry eyes)., Disp: , Rfl:    Multiple Vitamins-Minerals (MULTIVITAMIN WOMEN PO), Take 1 tablet by mouth daily., Disp: , Rfl:    simvastatin (ZOCOR) 40 MG tablet, Take 40 mg by mouth daily. Takes 40 mg daily., Disp: , Rfl:   Review of Systems  Review of Systems  Constitutional: Negative for fever, chills, weight loss, malaise/fatigue and diaphoresis.  HENT: Negative for hearing loss, ear pain, nosebleeds, congestion, sore throat, neck pain, tinnitus and ear discharge.   Eyes: Negative for blurred vision,  double vision, photophobia, pain, discharge and redness.  Respiratory: Negative for cough, hemoptysis, sputum production, shortness of breath, wheezing and stridor.   Cardiovascular: Negative for chest pain, palpitations, orthopnea, claudication, leg swelling and PND.  Gastrointestinal: negative for abdominal pain. Negative for heartburn, nausea, vomiting, diarrhea, constipation, blood in stool and melena.  Genitourinary: Negative for dysuria, urgency, frequency, hematuria and flank pain.  Musculoskeletal: Negative for myalgias, back pain, joint pain and falls.  Skin: Negative for itching and rash.  Neurological: Negative for dizziness, tingling, tremors, sensory change, speech change, focal weakness, seizures, loss of consciousness, weakness and headaches.  Endo/Heme/Allergies: Negative for environmental allergies and polydipsia. Does not bruise/bleed easily.  Psychiatric/Behavioral: Negative for depression, suicidal ideas, hallucinations, memory loss and substance abuse. The patient is not nervous/anxious and does not have insomnia.        Objective:  Blood pressure 122/63, pulse 73, height 5\' 2"  (1.575 m), weight 174 lb (78.9 kg), last menstrual period 03/31/2023.   Physical Exam  Vitals reviewed. Constitutional: She is oriented to person, place, and time. She appears well-developed and well-nourished.  HENT:  Head: Normocephalic and atraumatic.        Right Ear: External ear normal.  Left Ear: External ear normal.  Nose: Nose normal.  Mouth/Throat: Oropharynx is clear and moist.  Eyes: Conjunctivae and EOM are normal. Pupils are equal, round, and reactive to light. Right eye exhibits no discharge. Left eye exhibits no discharge. No scleral icterus.  Neck: Normal range of motion. Neck supple. No tracheal deviation present. No thyromegaly present.  Cardiovascular: Normal rate, regular rhythm, normal heart sounds and intact distal pulses.  Exam reveals no gallop and no friction rub.   No  murmur heard. Respiratory: Effort normal and breath sounds normal. No respiratory distress. She has no wheezes. She has no rales. She exhibits no tenderness.  GI: Soft. Bowel sounds are normal. She exhibits no distension and no mass. There is no tenderness. There is no rebound and no guarding.  Genitourinary:  Breasts no masses skin changes or nipple changes bilaterally      Vulva is normal without lesions Vagina is pink moist without discharge Cervix normal in appearance and pap is done Uterus is normal size shape and contour Adnexa is negative with normal sized ovaries   Musculoskeletal: Normal range of motion. She exhibits no edema and no tenderness.  Neurological: She is alert and oriented to person, place, and time. She has normal reflexes. She displays normal reflexes. No cranial nerve deficit. She exhibits normal muscle  tone. Coordination normal.  Skin: Skin is warm and dry. No rash noted. No erythema. No pallor.  Psychiatric: She has a normal mood and affect. Her behavior is normal. Judgment and thought content normal.       Medications Ordered at today's visit: No orders of the defined types were placed in this encounter.   Other orders placed at today's visit: No orders of the defined types were placed in this encounter.    ASSESSMENT + PLAN:    ICD-10-CM   1. Well woman exam with routine gynecological exam  Z01.419           No follow-ups on file.

## 2023-05-01 ENCOUNTER — Ambulatory Visit (HOSPITAL_COMMUNITY): Payer: 59

## 2023-05-16 ENCOUNTER — Encounter: Payer: Self-pay | Admitting: Internal Medicine

## 2023-05-16 ENCOUNTER — Ambulatory Visit: Payer: 59 | Admitting: Internal Medicine

## 2023-05-16 VITALS — BP 138/84 | HR 63 | Ht 63.0 in | Wt 173.8 lb

## 2023-05-16 DIAGNOSIS — Z23 Encounter for immunization: Secondary | ICD-10-CM

## 2023-05-16 DIAGNOSIS — Z114 Encounter for screening for human immunodeficiency virus [HIV]: Secondary | ICD-10-CM

## 2023-05-16 DIAGNOSIS — F3341 Major depressive disorder, recurrent, in partial remission: Secondary | ICD-10-CM | POA: Diagnosis not present

## 2023-05-16 DIAGNOSIS — E782 Mixed hyperlipidemia: Secondary | ICD-10-CM

## 2023-05-16 DIAGNOSIS — M5431 Sciatica, right side: Secondary | ICD-10-CM | POA: Diagnosis not present

## 2023-05-16 DIAGNOSIS — F331 Major depressive disorder, recurrent, moderate: Secondary | ICD-10-CM | POA: Insufficient documentation

## 2023-05-16 DIAGNOSIS — L729 Follicular cyst of the skin and subcutaneous tissue, unspecified: Secondary | ICD-10-CM

## 2023-05-16 DIAGNOSIS — R739 Hyperglycemia, unspecified: Secondary | ICD-10-CM

## 2023-05-16 DIAGNOSIS — Z1159 Encounter for screening for other viral diseases: Secondary | ICD-10-CM

## 2023-05-16 MED ORDER — DICLOFENAC SODIUM 75 MG PO TBEC: 75.0000 mg | DELAYED_RELEASE_TABLET | Freq: Every day | ORAL | 0 refills | Status: DC | PRN

## 2023-05-16 MED ORDER — CITALOPRAM HYDROBROMIDE 40 MG PO TABS: 40.0000 mg | ORAL_TABLET | Freq: Every day | ORAL | 1 refills | Status: DC

## 2023-05-16 MED ORDER — LOVASTATIN 40 MG PO TABS: 40.0000 mg | ORAL_TABLET | Freq: Every day | ORAL | 3 refills | Status: DC

## 2023-05-16 NOTE — Patient Instructions (Signed)
Please continue to take medications as prescribed. ? ?Please continue to follow low carb diet and perform moderate exercise/walking at least 150 mins/week. ?

## 2023-05-16 NOTE — Assessment & Plan Note (Signed)
Intermittent low back pain radiating to LLE likely due to sciatica Since she does not currently have pain, will avoid imaging Diclofenac as needed for pain Avoid heavy lifting and frequent bending Heating pad as needed

## 2023-05-16 NOTE — Progress Notes (Signed)
 New Patient Office Visit  Subjective:  Patient ID: Tara Heath, female    DOB: 1970/11/09  Age: 52 y.o. MRN: [id number]  CC:  Chief Complaint  Patient presents with   Establish Care    HPI Tara Heath is a 52 y.o. female with past medical history of MDD, HLD and obesity who presents for establishing care.  Spanish translator service was used for history taking purposes.  MDD: She is currently taking citalopram  40 mg QD.  She has tried multiple other antidepressants in the past, but has been doing well with citalopram  now.  Denies any anhedonia, SI or HI currently.  HLD: She takes lovastatin  40 mg QD.  Denies history of type II DM, CVA or CAD.  Cyst of abdominal wall: She reports having right upper abdominal skin mass, which has been stable for the last few months.  She denies any local soreness, erythema or swelling.  She also reports chronic, intermittent low back pain, radiating to the left buttock and LE area.  Denies any numbness or tingling of the LE currently.  Her pain appears to be triggered by heavy lifting at times.  She works in [employer] in [place].    Past Medical History:  Diagnosis Date   Cervical polyp    Depression    Hyperlipidemia     Past Surgical History:  Procedure Laterality Date   COLONOSCOPY WITH PROPOFOL  N/A 12/22/2020   Procedure: COLONOSCOPY WITH PROPOFOL ;  Surgeon: Cindie Carlin POUR, DO;  Location: [facility];  Service: Endoscopy;  Laterality: N/A;  ASA II / 12:00    Family History  Problem Relation Age of Onset   Hypertension Mother    Hyperlipidemia Mother    Cancer Father        brain   Breast cancer Maternal Aunt     Social History   Socioeconomic History   Marital status: Married    Spouse name: Not on file   Number of children: Not on file   Years of education: Not on file   Highest education level: Not on file  Occupational History   Not on file  Tobacco Use   Smoking status: Never   Smokeless tobacco:  Never  Vaping Use   Vaping status: Never Used  Substance and Sexual Activity   Alcohol use: Not Currently   Drug use: Not Currently   Sexual activity: Not Currently    Birth control/protection: None, Abstinence  Other Topics Concern   Not on file  Social History Narrative   Not on file   Social Drivers of Health   Financial Resource Strain: Low Risk  (10/07/2020)   Overall Financial Resource Strain (CARDIA)    Difficulty of Paying Living Expenses: Not very hard  Food Insecurity: No Food Insecurity (04/18/2023)   Hunger Vital Sign    Worried About Running Out of Food in the Last Year: Never true    Ran Out of Food in the Last Year: Never true  Transportation Needs: No Transportation Needs (04/18/2023)   PRAPARE - Administrator, Civil Service (Medical): No    Lack of Transportation (Non-Medical): No  Physical Activity: Inactive (04/18/2023)   Exercise Vital Sign    Days of Exercise per Week: 0 days    Minutes of Exercise per Session: 0 min  Stress: No Stress Concern Present (04/18/2023)   [employer] of Occupational Health - Occupational Stress Questionnaire    Feeling of Stress : Only a little  Social Connections: Moderately Isolated (  04/18/2023)   Social Connection and Isolation Panel [NHANES]    Frequency of Communication with Friends and Family: More than three times a week    Frequency of Social Gatherings with Friends and Family: Once a week    Attends Religious Services: More than 4 times per year    Active Member of [employer] or Organizations: No    Attends Banker Meetings: Never    Marital Status: Separated  Intimate Partner Violence: Not At Risk (04/18/2023)   Humiliation, Afraid, Rape, and Kick questionnaire    Fear of Current or Ex-Partner: No    Emotionally Abused: No    Physically Abused: No    Sexually Abused: No    ROS Review of Systems  Constitutional:  Negative for chills and fever.  HENT:  Negative for congestion, sinus  pressure, sinus pain and sore throat.   Eyes:  Negative for pain and discharge.  Respiratory:  Negative for cough and shortness of breath.   Cardiovascular:  Negative for chest pain and palpitations.  Gastrointestinal:  Negative for abdominal pain, diarrhea, nausea and vomiting.  Endocrine: Negative for polydipsia and polyuria.  Genitourinary:  Negative for dysuria and hematuria.  Musculoskeletal:  Positive for back pain. Negative for neck pain and neck stiffness.  Skin:  Negative for rash.  Neurological:  Negative for dizziness and weakness.  Psychiatric/Behavioral:  Negative for agitation and behavioral problems.     Objective:   Today's Vitals: BP 138/84 (BP Location: Right Arm)   Pulse 63   Ht 5' 3 (1.6 m)   Wt 173 lb 12.8 oz (78.8 kg)   LMP 03/31/2023   SpO2 98%   BMI 30.79 kg/m   Physical Exam Vitals reviewed.  Constitutional:      General: She is not in acute distress.    Appearance: She is not diaphoretic.  HENT:     Head: Normocephalic and atraumatic.     Nose: Nose normal.     Mouth/Throat:     Mouth: Mucous membranes are moist.  Eyes:     General: No scleral icterus.    Extraocular Movements: Extraocular movements intact.  Cardiovascular:     Rate and Rhythm: Normal rate and regular rhythm.     Heart sounds: Normal heart sounds. No murmur heard. Pulmonary:     Breath sounds: Normal breath sounds. No wheezing or rales.  Abdominal:     Palpations: Abdomen is soft.     Tenderness: There is no abdominal tenderness.  Musculoskeletal:     Cervical back: Neck supple. No tenderness.     Lumbar back: Tenderness (Lower lumbar paraspinal area) present. Normal range of motion.     Right lower leg: No edema.     Left lower leg: No edema.  Skin:    General: Skin is warm.     Findings: No rash.     Comments: Cyst over right upper quadrant abdominal wall - about 1 cm in diameter, no erythema or surrounding edema  Neurological:     General: No focal deficit present.      Mental Status: She is alert and oriented to person, place, and time.  Psychiatric:        Mood and Affect: Mood normal.        Behavior: Behavior normal.     Assessment & Plan:   Problem List Items Addressed This Visit       Nervous and Auditory   Sciatica of right side   Intermittent low back pain radiating to  LLE likely due to sciatica Since she does not currently have pain, will avoid imaging Diclofenac  as needed for pain Avoid heavy lifting and frequent bending Heating pad as needed      Relevant Medications   diclofenac  (VOLTAREN ) 75 MG EC tablet   citalopram  (CELEXA ) 40 MG tablet     Other   Subcutaneous cyst   Abdominal wall mass appears to be subcutaneous cyst Advised to contact if any change in size or if it gets infected      Recurrent major depressive disorder, in partial remission (HCC) - Primary   Well controlled with citalopram  40 mg once daily Has tried other antidepressants in the past      Relevant Medications   citalopram  (CELEXA ) 40 MG tablet   Other Relevant Orders   CBC with Differential/Platelet   CMP14+EGFR   TSH   Mixed hyperlipidemia   On lovastatin  40 mg once daily, refilled Check lipid profile      Relevant Medications   lovastatin  (MEVACOR ) 40 MG tablet   Other Relevant Orders   Lipid Profile   Other Visit Diagnoses       Hyperglycemia       Relevant Orders   Hemoglobin A1c     Need for hepatitis C screening test       Relevant Orders   Hepatitis C Antibody     Screening for HIV (human immunodeficiency virus)       Relevant Orders   HIV antibody (with reflex)     Encounter for immunization       Relevant Orders   Varicella-zoster vaccine IM (Completed)       Outpatient Encounter Medications as of 05/16/2023  Medication Sig   cholecalciferol (VITAMIN D3) 25 MCG (1000 UNIT) tablet Take 1,000 Units by mouth daily.   diclofenac  (VOLTAREN ) 75 MG EC tablet Take 1 tablet (75 mg total) by mouth daily as needed (Back  pain).   Multiple Vitamins-Minerals (MULTIVITAMIN WOMEN PO) Take 1 tablet by mouth daily.   [DISCONTINUED] citalopram  (CELEXA ) 40 MG tablet Take 40 mg by mouth daily.   [DISCONTINUED] lovastatin  (MEVACOR ) 40 MG tablet Take 40 mg by mouth daily.   citalopram  (CELEXA ) 40 MG tablet Take 1 tablet (40 mg total) by mouth daily.   Dextran 70-Hypromellose (ARTIFICIAL TEARS) 0.1-0.3 % SOLN Place 1 drop into both eyes daily as needed (dry eyes). (Patient not taking: Reported on 05/16/2023)   lovastatin  (MEVACOR ) 40 MG tablet Take 1 tablet (40 mg total) by mouth daily.   [DISCONTINUED] simvastatin (ZOCOR) 40 MG tablet Take 40 mg by mouth daily. Takes 40 mg daily. (Patient not taking: Reported on 05/16/2023)   No facility-administered encounter medications on file as of 05/16/2023.    Follow-up: Return in about 5 months (around 10/14/2023) for MDD and Shingrix  #2.   Suzzane MARLA Blanch, MD

## 2023-05-16 NOTE — Assessment & Plan Note (Signed)
Well controlled with citalopram 40 mg once daily Has tried other antidepressants in the past

## 2023-05-16 NOTE — Assessment & Plan Note (Signed)
On lovastatin 40 mg once daily, refilled Check lipid profile

## 2023-05-16 NOTE — Assessment & Plan Note (Signed)
Abdominal wall mass appears to be subcutaneous cyst Advised to contact if any change in size or if it gets infected

## 2023-05-17 LAB — HIV ANTIBODY (ROUTINE TESTING W REFLEX): HIV Screen 4th Generation wRfx: NONREACTIVE

## 2023-05-17 LAB — CMP14+EGFR
ALT: 22 [IU]/L (ref 0–32)
AST: 26 [IU]/L (ref 0–40)
Albumin: 4.9 g/dL (ref 3.8–4.9)
Alkaline Phosphatase: 111 [IU]/L (ref 44–121)
BUN/Creatinine Ratio: 16 (ref 9–23)
BUN: 13 mg/dL (ref 6–24)
Bilirubin Total: <0.2 mg/dL (ref 0.0–1.2)
CO2: 23 mmol/L (ref 20–29)
Calcium: 10.2 mg/dL (ref 8.7–10.2)
Chloride: 100 mmol/L (ref 96–106)
Creatinine, Ser: 0.8 mg/dL (ref 0.57–1.00)
Globulin, Total: 2.6 g/dL (ref 1.5–4.5)
Glucose: 92 mg/dL (ref 70–99)
Potassium: 4.3 mmol/L (ref 3.5–5.2)
Sodium: 139 mmol/L (ref 134–144)
Total Protein: 7.5 g/dL (ref 6.0–8.5)
eGFR: 89 mL/min/{1.73_m2} (ref >59)

## 2023-05-17 LAB — LIPID PANEL
Chol/HDL Ratio: 3.5 {ratio} (ref 0.0–4.4)
Cholesterol, Total: 212 mg/dL — ABNORMAL HIGH (ref 100–199)
HDL: 60 mg/dL (ref >39)
LDL Chol Calc (NIH): 133 mg/dL — ABNORMAL HIGH (ref 0–99)
Triglycerides: 108 mg/dL (ref 0–149)
VLDL Cholesterol Cal: 19 mg/dL (ref 5–40)

## 2023-05-17 LAB — CBC WITH DIFFERENTIAL/PLATELET
Basophils Absolute: 0 10*3/uL (ref 0.0–0.2)
Basos: 0 %
EOS (ABSOLUTE): 0.1 10*3/uL (ref 0.0–0.4)
Eos: 1 %
Hematocrit: 42.4 % (ref 34.0–46.6)
Hemoglobin: 14 g/dL (ref 11.1–15.9)
Immature Grans (Abs): 0 10*3/uL (ref 0.0–0.1)
Immature Granulocytes: 0 %
Lymphocytes Absolute: 3.5 10*3/uL — ABNORMAL HIGH (ref 0.7–3.1)
Lymphs: 40 %
MCH: 29.6 pg (ref 26.6–33.0)
MCHC: 33 g/dL (ref 31.5–35.7)
MCV: 90 fL (ref 79–97)
Monocytes Absolute: 0.6 10*3/uL (ref 0.1–0.9)
Monocytes: 7 %
Neutrophils Absolute: 4.7 10*3/uL (ref 1.4–7.0)
Neutrophils: 52 %
Platelets: 280 10*3/uL (ref 150–450)
RBC: 4.73 x10E6/uL (ref 3.77–5.28)
RDW: 12.8 % (ref 11.7–15.4)
WBC: 8.9 10*3/uL (ref 3.4–10.8)

## 2023-05-17 LAB — TSH: TSH: 1.95 u[IU]/mL (ref 0.450–4.500)

## 2023-05-17 LAB — HEMOGLOBIN A1C
Est. average glucose Bld gHb Est-mCnc: 114 mg/dL
Hgb A1c MFr Bld: 5.6 % (ref 4.8–5.6)

## 2023-05-17 LAB — HEPATITIS C ANTIBODY: Hep C Virus Ab: NONREACTIVE

## 2023-09-02 IMAGING — MG MM DIGITAL SCREENING BILAT W/ TOMO AND CAD
8 series · 8 of 24 positions shown · non-contrast
Comparison: Previous exam(s).

CLINICAL DATA: Screening.

EXAM:
DIGITAL SCREENING BILATERAL MAMMOGRAM WITH TOMOSYNTHESIS AND CAD
TECHNIQUE: Bilateral screening digital craniocaudal and mediolateral oblique
mammograms were obtained. Bilateral screening digital breast
tomosynthesis was performed. The images were evaluated with
computer-aided detection.

[L MLO synth-2D]
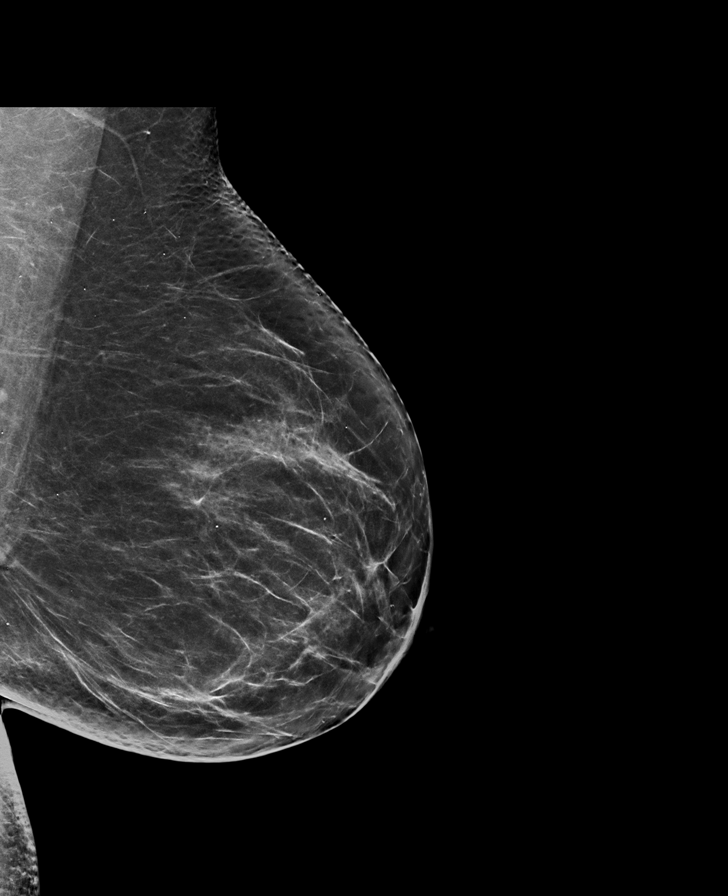

[L CC synth-2D]
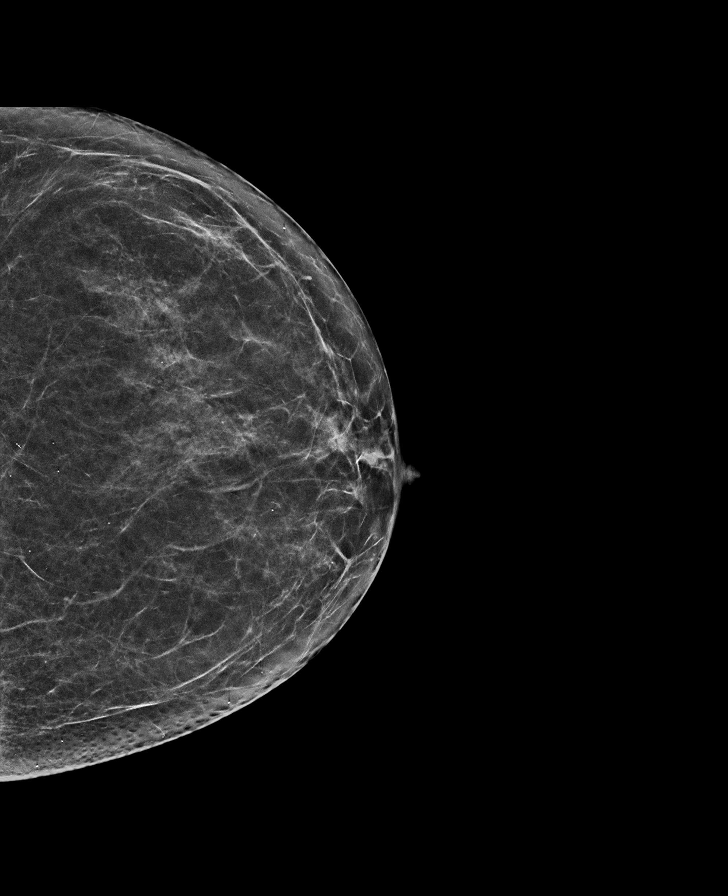

[R CC synth-2D]
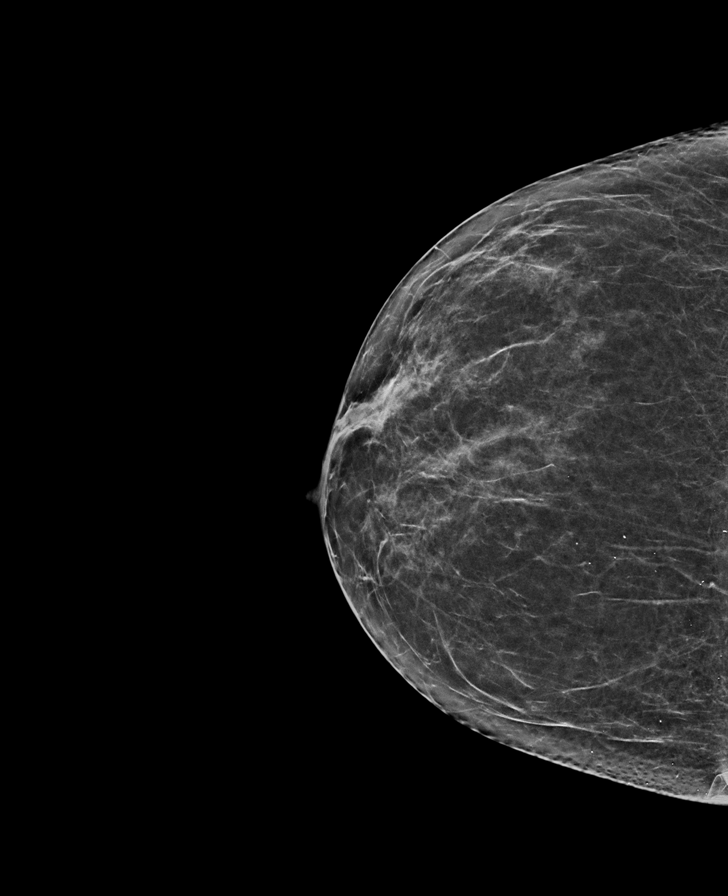

[R MLO synth-2D]
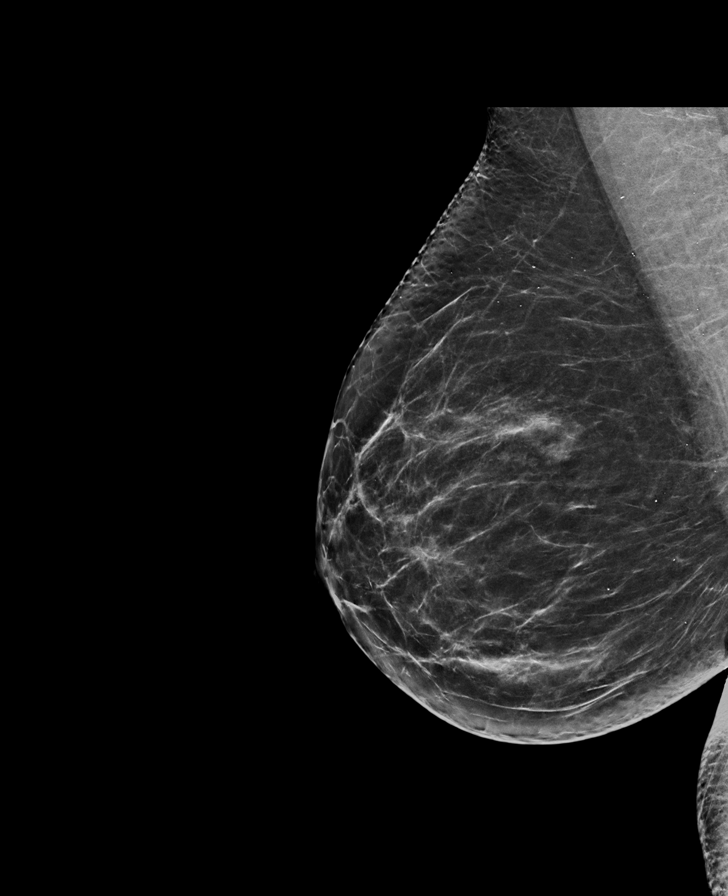

[R CC tomo · tomo slice 31/61.0]
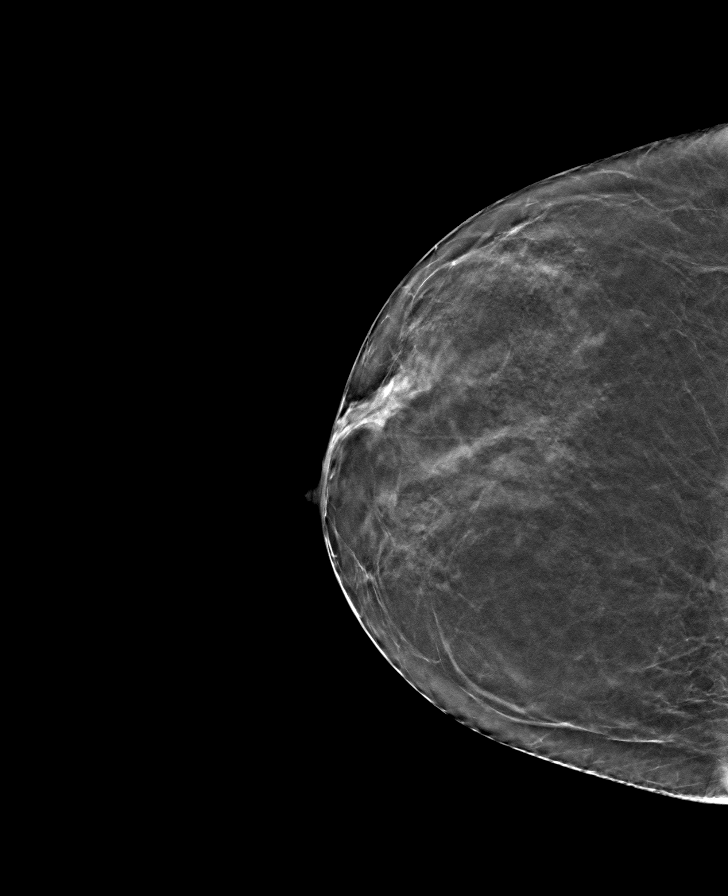

[L CC tomo · tomo slice 31/62.0]
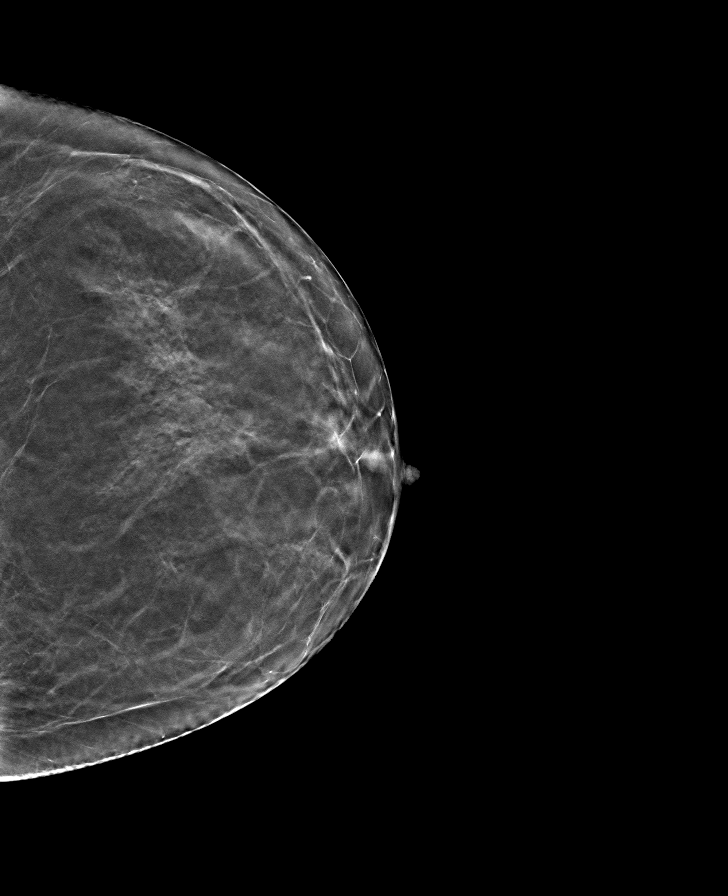

[R MLO tomo · tomo slice 37/73.0]
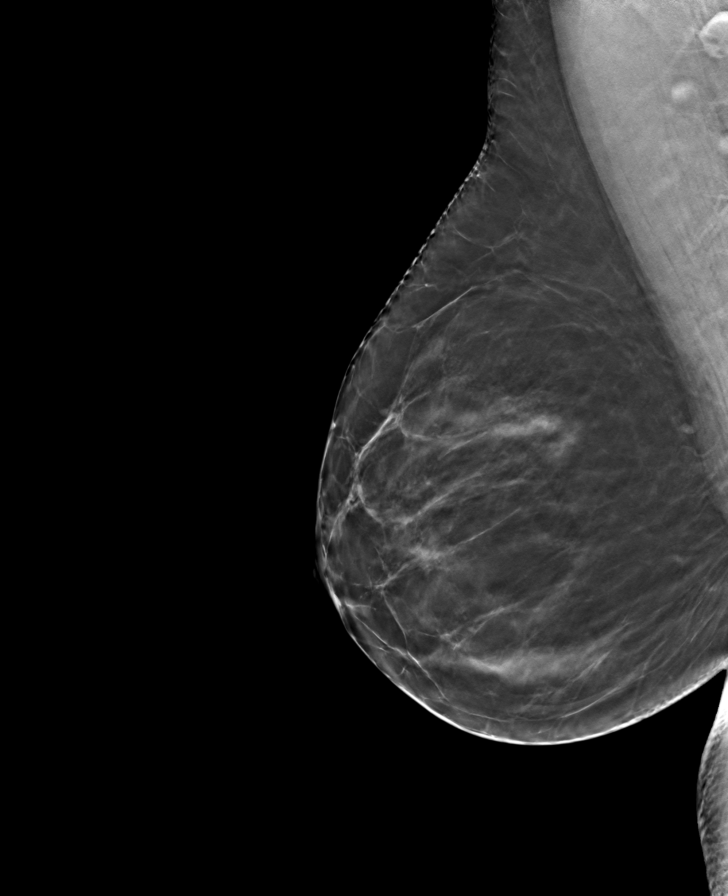

[L MLO tomo · tomo slice 39/77.0]
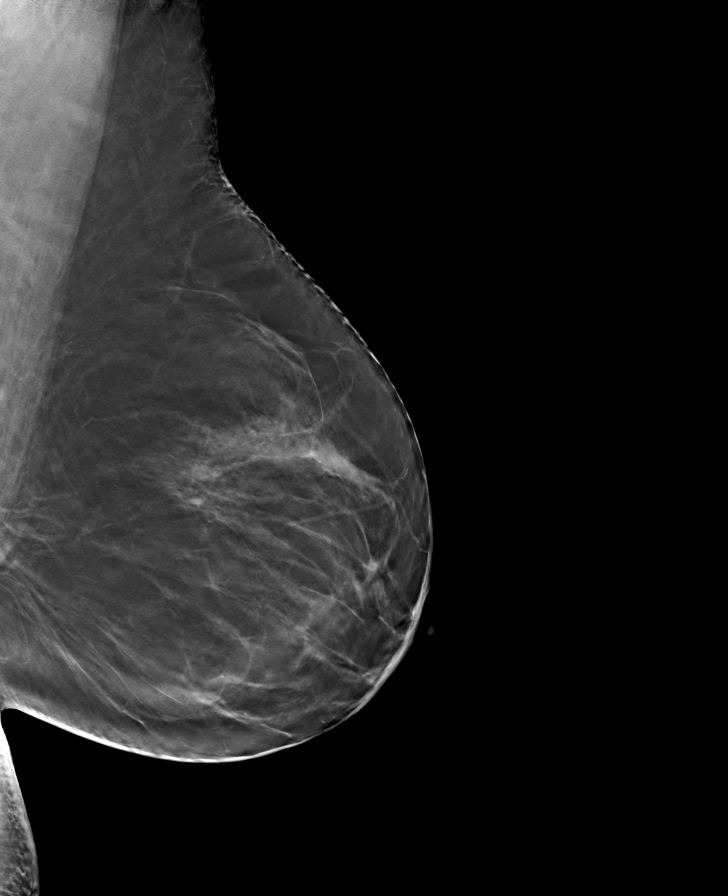

[8 of 24 positions shown; findings below may reference images not displayed]

ACR Breast Density Category b: There are scattered areas of
fibroglandular density.
FINDINGS: There are no findings suspicious for malignancy.
IMPRESSION: No mammographic evidence of malignancy. A result letter of this
screening mammogram will be mailed directly to the patient.

RECOMMENDATION:
Screening mammogram in one year. (Code:51-O-LD2)

BI-RADS CATEGORY  1: Negative.

## 2023-10-12 ENCOUNTER — Ambulatory Visit: Payer: 59 | Admitting: Internal Medicine

## 2023-10-12 ENCOUNTER — Encounter: Payer: Self-pay | Admitting: Internal Medicine

## 2023-10-12 VITALS — BP 123/78 | HR 72 | Ht 63.0 in | Wt 179.0 lb

## 2023-10-12 DIAGNOSIS — E782 Mixed hyperlipidemia: Secondary | ICD-10-CM | POA: Diagnosis not present

## 2023-10-12 DIAGNOSIS — J309 Allergic rhinitis, unspecified: Secondary | ICD-10-CM | POA: Insufficient documentation

## 2023-10-12 DIAGNOSIS — F3341 Major depressive disorder, recurrent, in partial remission: Secondary | ICD-10-CM

## 2023-10-12 DIAGNOSIS — K219 Gastro-esophageal reflux disease without esophagitis: Secondary | ICD-10-CM | POA: Insufficient documentation

## 2023-10-12 DIAGNOSIS — Z23 Encounter for immunization: Secondary | ICD-10-CM

## 2023-10-12 DIAGNOSIS — M85649 Other cyst of bone, unspecified hand: Secondary | ICD-10-CM | POA: Insufficient documentation

## 2023-10-12 MED ORDER — FLUTICASONE PROPIONATE 50 MCG/ACT NA SUSP: 2.0000 | Freq: Every day | NASAL | 6 refills | Status: AC

## 2023-10-12 MED ORDER — OMEPRAZOLE 20 MG PO CPDR: 20.0000 mg | DELAYED_RELEASE_CAPSULE | Freq: Every day | ORAL | 3 refills | Status: DC

## 2023-10-12 MED ORDER — BUSPIRONE HCL 5 MG PO TABS
5.0000 mg | ORAL_TABLET | Freq: Two times a day (BID) | ORAL | 3 refills | Status: DC
Start: 2023-10-12 — End: 2024-02-23

## 2023-10-12 NOTE — Assessment & Plan Note (Signed)
 Uncontrolled with citalopram  40 mg once daily Has tried other antidepressants in the past Added BuSpar  for anxiety

## 2023-10-12 NOTE — Assessment & Plan Note (Signed)
 Epigastric pain and throat irritation could be due to GERD/laryngeal reflux Started omeprazole  20 mg once daily Advised to avoid hot and spicy food

## 2023-10-12 NOTE — Assessment & Plan Note (Addendum)
 Sinus pressure related headache and postnasal drip likely due to allergies Flonase prescribed If persistent, can consider Zyrtec or Claritin

## 2023-10-12 NOTE — Assessment & Plan Note (Signed)
 Bulging over left thumb and index finger appear to be bone cyst vs nodes (arthritis) Advised to apply Voltaren  gel as needed for pain

## 2023-10-12 NOTE — Assessment & Plan Note (Signed)
 On lovastatin  40 mg once daily, refilled Checked lipid profile

## 2023-10-12 NOTE — Patient Instructions (Addendum)
 Please start taking Buspirone as prescribed.  Please use Flonase for nasal congestion/allergies.  Please perform warm water gargling and/or use Phenol throat spray as needed for sore throat.

## 2023-10-12 NOTE — Progress Notes (Signed)
 New Patient Office Visit  Subjective:  Patient ID: Tara Heath, female    DOB: Aug 31, 1970  Age: 53 y.o. MRN: [id number]  CC:  Chief Complaint  Patient presents with   Medical Management of Chronic Issues    5 month f/u. Pt reports sx of dry throat worsens at night.     HPI Tara Heath is a 53 y.o. female with past medical history of MDD, HLD and obesity who presents for f/u of her chronic medical conditions.  Spanish translator service was used for history taking purposes.  MDD: She reports anhedonia, recent worsening of stress and anxiety due to household conditions.  She is currently taking citalopram  40 mg QD.  She has tried multiple other antidepressants in the past, but had been doing well with citalopram  now. Denies SI or HI currently.  HLD: She takes lovastatin  40 mg QD.  Denies history of type II DM, CVA or CAD.  GERD: She reports sore throat and epigastric discomfort, especially worse at nighttime.  Denies dysphagia, odynophagia, nausea or vomiting currently.  Of note, she also reports postnasal drip and sinus pressure related headache, which are attributed to allergies.  Denies any fever or chills.    Past Medical History:  Diagnosis Date   Cervical polyp    Depression    Hyperlipidemia     Past Surgical History:  Procedure Laterality Date   COLONOSCOPY WITH PROPOFOL  N/A 12/22/2020   Procedure: COLONOSCOPY WITH PROPOFOL ;  Surgeon: Vinetta Greening, DO;  Location: [facility];  Service: Endoscopy;  Laterality: N/A;  ASA II / 12:00    Family History  Problem Relation Age of Onset   Hypertension Mother    Hyperlipidemia Mother    Cancer Father        brain   Breast cancer Maternal Aunt     Social History   Socioeconomic History   Marital status: Married    Spouse name: Not on file   Number of children: Not on file   Years of education: Not on file   Highest education level: Not on file  Occupational History   Not on file  Tobacco Use    Smoking status: Never   Smokeless tobacco: Never  Vaping Use   Vaping status: Never Used  Substance and Sexual Activity   Alcohol use: Not Currently   Drug use: Not Currently   Sexual activity: Not Currently    Birth control/protection: None, Abstinence  Other Topics Concern   Not on file  Social History Narrative   Not on file   Social Drivers of Health   Financial Resource Strain: Low Risk  (10/07/2020)   Overall Financial Resource Strain (CARDIA)    Difficulty of Paying Living Expenses: Not very hard  Food Insecurity: No Food Insecurity (04/18/2023)   Hunger Vital Sign    Worried About Running Out of Food in the Last Year: Never true    Ran Out of Food in the Last Year: Never true  Transportation Needs: No Transportation Needs (04/18/2023)   PRAPARE - Administrator, Civil Service (Medical): No    Lack of Transportation (Non-Medical): No  Physical Activity: Inactive (04/18/2023)   Exercise Vital Sign    Days of Exercise per Week: 0 days    Minutes of Exercise per Session: 0 min  Stress: No Stress Concern Present (04/18/2023)   [employer] of Occupational Health - Occupational Stress Questionnaire    Feeling of Stress : Only a little  Social Connections: Moderately  Isolated (04/18/2023)   Social Connection and Isolation Panel [NHANES]    Frequency of Communication with Friends and Family: More than three times a week    Frequency of Social Gatherings with Friends and Family: Once a week    Attends Religious Services: More than 4 times per year    Active Member of [employer] or Organizations: No    Attends Banker Meetings: Never    Marital Status: Separated  Intimate Partner Violence: Not At Risk (04/18/2023)   Humiliation, Afraid, Rape, and Kick questionnaire    Fear of Current or Ex-Partner: No    Emotionally Abused: No    Physically Abused: No    Sexually Abused: No    ROS Review of Systems  Constitutional:  Negative for chills and fever.   HENT:  Positive for congestion, postnasal drip, sinus pressure and sore throat.   Eyes:  Negative for pain and discharge.  Respiratory:  Negative for cough and shortness of breath.   Cardiovascular:  Negative for chest pain and palpitations.  Gastrointestinal:  Positive for abdominal pain (Epigastric). Negative for diarrhea, nausea and vomiting.  Endocrine: Negative for polydipsia and polyuria.  Genitourinary:  Negative for dysuria and hematuria.  Musculoskeletal:  Positive for back pain. Negative for neck pain and neck stiffness.  Skin:  Negative for rash.  Neurological:  Negative for dizziness and weakness.  Psychiatric/Behavioral:  Negative for agitation and behavioral problems.     Objective:   Today's Vitals: BP 123/78   Pulse 72   Ht 5\' 3"  (1.6 m)   Wt 179 lb (81.2 kg)   SpO2 97%   BMI 31.71 kg/m   Physical Exam Vitals reviewed.  Constitutional:      General: She is not in acute distress.    Appearance: She is not diaphoretic.  HENT:     Head: Normocephalic and atraumatic.     Nose: Congestion present.     Right Sinus: Frontal sinus tenderness present.     Left Sinus: Frontal sinus tenderness present.     Mouth/Throat:     Mouth: Mucous membranes are moist.  Eyes:     General: No scleral icterus.    Extraocular Movements: Extraocular movements intact.  Cardiovascular:     Rate and Rhythm: Normal rate and regular rhythm.     Heart sounds: Normal heart sounds. No murmur heard. Pulmonary:     Breath sounds: Normal breath sounds. No wheezing or rales.  Abdominal:     Palpations: Abdomen is soft.     Tenderness: There is abdominal tenderness (Mild, epigastric).  Musculoskeletal:     Cervical back: Neck supple. No tenderness.     Lumbar back: Tenderness (Lower lumbar paraspinal area) present. Normal range of motion.     Right lower leg: No edema.     Left lower leg: No edema.     Comments: Bone cyst over left thumb base and index finger  Skin:    General: Skin  is warm.     Findings: No rash.     Comments: Cyst over right upper quadrant abdominal wall - about 1 cm in diameter, no erythema or surrounding edema  Neurological:     General: No focal deficit present.     Mental Status: She is alert and oriented to person, place, and time.  Psychiatric:        Mood and Affect: Mood normal.        Behavior: Behavior normal.     Assessment & Plan:  Problem List Items Addressed This Visit       Respiratory   Allergic sinusitis - Primary   Sinus pressure related headache and postnasal drip likely due to allergies Flonase  prescribed If persistent, can consider Zyrtec or Claritin      Relevant Medications   fluticasone  (FLONASE ) 50 MCG/ACT nasal spray     Digestive   Gastroesophageal reflux disease   Epigastric pain and throat irritation could be due to GERD/laryngeal reflux Started omeprazole  20 mg once daily Advised to avoid hot and spicy food      Relevant Medications   omeprazole  (PRILOSEC) 20 MG capsule     Musculoskeletal and Integument   Bone cyst of hand   Bulging over left thumb and index finger appear to be bone cyst vs nodes (arthritis) Advised to apply Voltaren  gel as needed for pain        Other   Recurrent major depressive disorder, in partial remission (HCC)   Uncontrolled with citalopram  40 mg once daily Has tried other antidepressants in the past Added BuSpar  for anxiety      Relevant Medications   busPIRone  (BUSPAR ) 5 MG tablet   Mixed hyperlipidemia   On lovastatin  40 mg once daily, refilled Checked lipid profile      Other Visit Diagnoses       Encounter for immunization       Relevant Orders   Varicella-zoster vaccine IM (Completed)        Outpatient Encounter Medications as of 10/12/2023  Medication Sig   busPIRone  (BUSPAR ) 5 MG tablet Take 1 tablet (5 mg total) by mouth 2 (two) times daily.   cholecalciferol (VITAMIN D3) 25 MCG (1000 UNIT) tablet Take 1,000 Units by mouth daily.    citalopram  (CELEXA ) 40 MG tablet Take 1 tablet (40 mg total) by mouth daily.   Dextran 70-Hypromellose (ARTIFICIAL TEARS) 0.1-0.3 % SOLN Place 1 drop into both eyes daily as needed (dry eyes).   diclofenac  (VOLTAREN ) 75 MG EC tablet Take 1 tablet (75 mg total) by mouth daily as needed (Back pain).   fluticasone  (FLONASE ) 50 MCG/ACT nasal spray Place 2 sprays into both nostrils daily.   lovastatin  (MEVACOR ) 40 MG tablet Take 1 tablet (40 mg total) by mouth daily.   Multiple Vitamins-Minerals (MULTIVITAMIN WOMEN PO) Take 1 tablet by mouth daily.   omeprazole  (PRILOSEC) 20 MG capsule Take 1 capsule (20 mg total) by mouth daily.   No facility-administered encounter medications on file as of 10/12/2023.    Follow-up: Return in about 3 months (around 01/12/2024) for MDD.   Meldon Sport, MD

## 2024-01-16 ENCOUNTER — Ambulatory Visit: Admitting: Internal Medicine

## 2024-01-28 ENCOUNTER — Other Ambulatory Visit: Payer: Self-pay | Admitting: Internal Medicine

## 2024-01-28 DIAGNOSIS — F3341 Major depressive disorder, recurrent, in partial remission: Secondary | ICD-10-CM

## 2024-02-23 ENCOUNTER — Encounter: Payer: Self-pay | Admitting: Internal Medicine

## 2024-02-23 ENCOUNTER — Ambulatory Visit: Admitting: Internal Medicine

## 2024-02-23 VITALS — BP 127/80 | HR 77 | Ht 63.0 in | Wt 180.4 lb

## 2024-02-23 DIAGNOSIS — K219 Gastro-esophageal reflux disease without esophagitis: Secondary | ICD-10-CM

## 2024-02-23 DIAGNOSIS — E559 Vitamin D deficiency, unspecified: Secondary | ICD-10-CM

## 2024-02-23 DIAGNOSIS — F331 Major depressive disorder, recurrent, moderate: Secondary | ICD-10-CM | POA: Diagnosis not present

## 2024-02-23 DIAGNOSIS — J309 Allergic rhinitis, unspecified: Secondary | ICD-10-CM | POA: Diagnosis not present

## 2024-02-23 DIAGNOSIS — R739 Hyperglycemia, unspecified: Secondary | ICD-10-CM

## 2024-02-23 DIAGNOSIS — F411 Generalized anxiety disorder: Secondary | ICD-10-CM | POA: Diagnosis not present

## 2024-02-23 DIAGNOSIS — E782 Mixed hyperlipidemia: Secondary | ICD-10-CM

## 2024-02-23 MED ORDER — OMEPRAZOLE 40 MG PO CPDR: 40.0000 mg | DELAYED_RELEASE_CAPSULE | Freq: Every day | ORAL | 3 refills | Status: AC

## 2024-02-23 MED ORDER — BUSPIRONE HCL 10 MG PO TABS: 10.0000 mg | ORAL_TABLET | Freq: Two times a day (BID) | ORAL | 3 refills | Status: AC

## 2024-02-23 NOTE — Assessment & Plan Note (Signed)
 Sinus pressure related headache and postnasal drip likely due to allergies Flonase prescribed If persistent, can consider Zyrtec or Claritin

## 2024-02-23 NOTE — Progress Notes (Signed)
 New Patient Office Visit  Subjective:  Patient ID: Tara Heath, female    DOB: 10/31/1970  Age: 53 y.o. MRN: [id number]  CC:  Chief Complaint  Patient presents with   Depression    3 month f/u     HPI Tara Heath is a 53 y.o. female with past medical history of MDD, HLD and obesity who presents for f/u of her chronic medical conditions.  MDD: She still reports anhedonia, and anxiety due to household conditions.  She does not argue with her family members now, but feels alone.  She has spells of anxiety and thinks of the worst at times. She is currently taking citalopram  40 mg once daily and feels that she remains stable due to the medicine.  She has felt mild improvement in anxiety with BuSpar .  Denies SI or HI currently.  HLD: She takes lovastatin  40 mg QD.  Denies history of type II DM, CVA or CAD.  GERD: She reports improvement in sore throat and epigastric discomfort, especially worse at nighttime, but still has epigastric pain at times.  Denies dysphagia, odynophagia, nausea or vomiting currently.  Of note, she also reports postnasal drip and sinus pressure related headache, which are attributed to allergies.  Denies any fever or chills.    Past Medical History:  Diagnosis Date   Cervical polyp    Depression    Hyperlipidemia     Past Surgical History:  Procedure Laterality Date   COLONOSCOPY WITH PROPOFOL  N/A 12/22/2020   Procedure: COLONOSCOPY WITH PROPOFOL ;  Surgeon: Cindie Carlin POUR, DO;  Location: [facility];  Service: Endoscopy;  Laterality: N/A;  ASA II / 12:00    Family History  Problem Relation Age of Onset   Hypertension Mother    Hyperlipidemia Mother    Cancer Father        brain   Breast cancer Maternal Aunt     Social History   Socioeconomic History   Marital status: Married    Spouse name: Not on file   Number of children: Not on file   Years of education: Not on file   Highest education level: Not on file  Occupational History    Not on file  Tobacco Use   Smoking status: Never   Smokeless tobacco: Never  Vaping Use   Vaping status: Never Used  Substance and Sexual Activity   Alcohol use: Not Currently   Drug use: Not Currently   Sexual activity: Not Currently    Birth control/protection: None, Abstinence  Other Topics Concern   Not on file  Social History Narrative   Not on file   Social Drivers of Health   Financial Resource Strain: Low Risk  (10/07/2020)   Overall Financial Resource Strain (CARDIA)    Difficulty of Paying Living Expenses: Not very hard  Food Insecurity: No Food Insecurity (04/18/2023)   Hunger Vital Sign    Worried About Running Out of Food in the Last Year: Never true    Ran Out of Food in the Last Year: Never true  Transportation Needs: No Transportation Needs (04/18/2023)   PRAPARE - Administrator, Civil Service (Medical): No    Lack of Transportation (Non-Medical): No  Physical Activity: Inactive (04/18/2023)   Exercise Vital Sign    Days of Exercise per Week: 0 days    Minutes of Exercise per Session: 0 min  Stress: No Stress Concern Present (04/18/2023)   [employer] of Occupational Health - Occupational Stress Questionnaire  Feeling of Stress : Only a little  Social Connections: Moderately Isolated (04/18/2023)   Social Connection and Isolation Panel    Frequency of Communication with Friends and Family: More than three times a week    Frequency of Social Gatherings with Friends and Family: Once a week    Attends Religious Services: More than 4 times per year    Active Member of [employer] or Organizations: No    Attends Banker Meetings: Never    Marital Status: Separated  Intimate Partner Violence: Not At Risk (04/18/2023)   Humiliation, Afraid, Rape, and Kick questionnaire    Fear of Current or Ex-Partner: No    Emotionally Abused: No    Physically Abused: No    Sexually Abused: No    ROS Review of Systems  Constitutional:  Negative for  chills and fever.  HENT:  Positive for congestion and sore throat.   Eyes:  Negative for pain and discharge.  Respiratory:  Negative for cough and shortness of breath.   Cardiovascular:  Negative for chest pain and palpitations.  Gastrointestinal:  Positive for abdominal pain (Epigastric). Negative for diarrhea, nausea and vomiting.  Endocrine: Negative for polydipsia and polyuria.  Genitourinary:  Negative for dysuria and hematuria.  Musculoskeletal:  Positive for back pain. Negative for neck pain and neck stiffness.  Skin:  Negative for rash.  Neurological:  Negative for dizziness and weakness.  Psychiatric/Behavioral:  Negative for agitation and behavioral problems.     Objective:   Today's Vitals: BP 127/80   Pulse 77   Ht 5' 3 (1.6 m)   Wt 180 lb 6.4 oz (81.8 kg)   SpO2 98%   BMI 31.96 kg/m   Physical Exam Vitals reviewed.  Constitutional:      General: She is not in acute distress.    Appearance: She is not diaphoretic.  HENT:     Head: Normocephalic and atraumatic.     Nose: Congestion present.     Mouth/Throat:     Mouth: Mucous membranes are moist.  Eyes:     General: No scleral icterus.    Extraocular Movements: Extraocular movements intact.  Cardiovascular:     Rate and Rhythm: Normal rate and regular rhythm.     Heart sounds: Normal heart sounds. No murmur heard. Pulmonary:     Breath sounds: Normal breath sounds. No wheezing or rales.  Abdominal:     Palpations: Abdomen is soft.     Tenderness: There is abdominal tenderness (Mild, epigastric).  Musculoskeletal:     Cervical back: Neck supple. No tenderness.     Lumbar back: Tenderness (Lower lumbar paraspinal area) present. Normal range of motion.     Right lower leg: No edema.     Left lower leg: No edema.     Comments: Bone cyst over left thumb base and index finger  Skin:    General: Skin is warm.     Findings: No rash.     Comments: Cyst over right upper quadrant abdominal wall - about 1 cm in  diameter, no erythema or surrounding edema  Neurological:     General: No focal deficit present.     Mental Status: She is alert and oriented to person, place, and time.  Psychiatric:        Mood and Affect: Mood normal.        Behavior: Behavior normal.     Assessment & Plan:   Problem List Items Addressed This Visit  Respiratory   Allergic sinusitis   Sinus pressure related headache and postnasal drip likely due to allergies Flonase  prescribed If persistent, can consider Zyrtec or Claritin        Digestive   Gastroesophageal reflux disease   Epigastric pain and throat irritation could be due to GERD/laryngeal reflux Better with omeprazole  20 mg once daily, but still has persistent symptoms Increase dose of omeprazole  to 40 mg QD Advised to avoid hot and spicy food      Relevant Medications   omeprazole  (PRILOSEC) 40 MG capsule     Other   Moderate episode of recurrent major depressive disorder (HCC) - Primary   Uncontrolled with citalopram  40 mg once daily Has tried other antidepressants in the past, but reports getting better response with citalopram  Added BuSpar  for anxiety, increase dose to 10 mg BID now      Relevant Medications   busPIRone  (BUSPAR ) 10 MG tablet   Other Relevant Orders   TSH   CMP14+EGFR   CBC with Differential/Platelet   Ambulatory referral to Psychology   Mixed hyperlipidemia   On lovastatin  40 mg once daily, refilled Checked lipid profile      Relevant Orders   Lipid panel   GAD (generalized anxiety disorder)   Uncontrolled Increase dose of BuSpar  to 10 mg BID Refer to [facility] therapy      Relevant Medications   busPIRone  (BUSPAR ) 10 MG tablet   Other Visit Diagnoses       Hyperglycemia       Relevant Orders   Hemoglobin A1c   CMP14+EGFR     Vitamin D deficiency       Relevant Orders   VITAMIN D 25 Hydroxy (Vit-D Deficiency, Fractures)         Outpatient Encounter Medications as of 02/23/2024  Medication Sig    cholecalciferol (VITAMIN D3) 25 MCG (1000 UNIT) tablet Take 1,000 Units by mouth daily.   citalopram  (CELEXA ) 40 MG tablet Take 1 tablet by mouth once daily   Dextran 70-Hypromellose (ARTIFICIAL TEARS) 0.1-0.3 % SOLN Place 1 drop into both eyes daily as needed (dry eyes).   fluticasone  (FLONASE ) 50 MCG/ACT nasal spray Place 2 sprays into both nostrils daily.   lovastatin  (MEVACOR ) 40 MG tablet Take 1 tablet (40 mg total) by mouth daily.   Multiple Vitamins-Minerals (MULTIVITAMIN WOMEN PO) Take 1 tablet by mouth daily.   [DISCONTINUED] busPIRone  (BUSPAR ) 5 MG tablet Take 1 tablet (5 mg total) by mouth 2 (two) times daily.   [DISCONTINUED] diclofenac  (VOLTAREN ) 75 MG EC tablet Take 1 tablet (75 mg total) by mouth daily as needed (Back pain).   [DISCONTINUED] omeprazole  (PRILOSEC) 20 MG capsule Take 1 capsule (20 mg total) by mouth daily.   busPIRone  (BUSPAR ) 10 MG tablet Take 1 tablet (10 mg total) by mouth 2 (two) times daily.   omeprazole  (PRILOSEC) 40 MG capsule Take 1 capsule (40 mg total) by mouth daily.   No facility-administered encounter medications on file as of 02/23/2024.    Follow-up: Return in about 4 months (around 06/25/2024) for MDD and GERD.   Suzzane MARLA Blanch, MD

## 2024-02-23 NOTE — Assessment & Plan Note (Signed)
 On lovastatin  40 mg once daily, refilled Checked lipid profile

## 2024-02-23 NOTE — Assessment & Plan Note (Addendum)
 Epigastric pain and throat irritation could be due to GERD/laryngeal reflux Better with omeprazole  20 mg once daily, but still has persistent symptoms Increase dose of omeprazole  to 40 mg QD Advised to avoid hot and spicy food

## 2024-02-23 NOTE — Patient Instructions (Signed)
 Please start taking Omeprazole  40 mg once daily instead of 20 mg for acid reflux.  Please start taking Buspirone  10 mg twice daily instead of 5 mg for anxiety.  Please continue to take other medications as prescribed.  Please continue to follow low carb diet and perform moderate exercise/walking at least 150 mins/week.  Please get fasting blood tests done before the next visit.

## 2024-02-23 NOTE — Assessment & Plan Note (Signed)
 Uncontrolled Increase dose of BuSpar  to 10 mg BID Refer to [facility] therapy

## 2024-02-23 NOTE — Assessment & Plan Note (Signed)
 Uncontrolled with citalopram  40 mg once daily Has tried other antidepressants in the past, but reports getting better response with citalopram  Added BuSpar  for anxiety, increase dose to 10 mg BID now

## 2024-02-28 ENCOUNTER — Encounter: Payer: Self-pay | Admitting: Obstetrics & Gynecology

## 2024-03-15 ENCOUNTER — Other Ambulatory Visit (HOSPITAL_COMMUNITY): Payer: Self-pay | Admitting: Obstetrics & Gynecology

## 2024-03-15 DIAGNOSIS — Z1231 Encounter for screening mammogram for malignant neoplasm of breast: Secondary | ICD-10-CM

## 2024-03-18 ENCOUNTER — Telehealth: Payer: Self-pay

## 2024-03-18 NOTE — Telephone Encounter (Signed)
 Patient informed.

## 2024-03-18 NOTE — Telephone Encounter (Signed)
 Copied from CRM [id number]. Topic: Appointments - Scheduling Inquiry for Clinic >> Mar 15, 2024  4:55 PM Sophia H wrote: Reason for CRM: Spoke with patients daughter Warren - Patient is needing a green card physical and I know that is likely different from a regular physical but needing to know if PCP is certified to do this. Please reach out and advise # [phone]

## 2024-03-29 ENCOUNTER — Ambulatory Visit (HOSPITAL_COMMUNITY)
Admission: RE | Admit: 2024-03-29 | Discharge: 2024-03-29 | Disposition: A | Discharge status: Home / Self Care | Source: Ambulatory Visit | Attending: Obstetrics & Gynecology | Admitting: Obstetrics & Gynecology

## 2024-03-29 DIAGNOSIS — Z1231 Encounter for screening mammogram for malignant neoplasm of breast: Secondary | ICD-10-CM | POA: Insufficient documentation

## 2024-04-04 ENCOUNTER — Telehealth: Payer: Self-pay

## 2024-04-04 NOTE — Telephone Encounter (Signed)
 Copied from CRM #[id number]. Topic: Medical Record Request - Other >> Apr 04, 2024 11:47 AM Larissa RAMAN wrote: Reason for CRM: Patient is requesting to have a copy of her shingles vaccine faxed to [phone].

## 2024-04-04 NOTE — Telephone Encounter (Signed)
 Immunization record faxed.

## 2024-04-19 ENCOUNTER — Ambulatory Visit: Admitting: Obstetrics & Gynecology

## 2024-04-23 ENCOUNTER — Ambulatory Visit: Admitting: Obstetrics & Gynecology

## 2024-04-23 ENCOUNTER — Encounter: Payer: Self-pay | Admitting: Obstetrics & Gynecology

## 2024-04-23 ENCOUNTER — Other Ambulatory Visit (HOSPITAL_COMMUNITY)
Admission: RE | Admit: 2024-04-23 | Discharge: 2024-04-23 | Disposition: A | Source: Ambulatory Visit | Attending: Obstetrics & Gynecology | Admitting: Obstetrics & Gynecology

## 2024-04-23 VITALS — BP 136/83 | HR 74 | Ht 62.0 in | Wt 181.0 lb

## 2024-04-23 DIAGNOSIS — Z1272 Encounter for screening for malignant neoplasm of vagina: Secondary | ICD-10-CM

## 2024-04-23 DIAGNOSIS — Z01419 Encounter for gynecological examination (general) (routine) without abnormal findings: Secondary | ICD-10-CM

## 2024-04-23 NOTE — Progress Notes (Signed)
 Subjective:     Tara Heath is a 53 y.o. female here for a routine exam.  Patient's last menstrual period was 10/26/2023. [id number] Birth Control Method:  perimenopausal Menstrual Calendar(currently): amenorrhea 6 months  Current complaints: none.   Current acute medical issues:  none   Recent Gynecologic History Patient's last menstrual period was 10/26/2023. Last Pap: 2021,  normal Last mammogram: 11/25,  normal  Past Medical History:  Diagnosis Date   Cervical polyp    Depression    Hyperlipidemia     Past Surgical History:  Procedure Laterality Date   COLONOSCOPY WITH PROPOFOL  N/A 12/22/2020   Procedure: COLONOSCOPY WITH PROPOFOL ;  Surgeon: Cindie Carlin POUR, DO;  Location: [facility];  Service: Endoscopy;  Laterality: N/A;  ASA II / 12:00    OB History     Gravida  3   Para  3   Term  3   Preterm      AB      Living  3      SAB      IAB      Ectopic      Multiple      Live Births  3           Social History   Socioeconomic History   Marital status: Married    Spouse name: Not on file   Number of children: Not on file   Years of education: Not on file   Highest education level: Not on file  Occupational History   Not on file  Tobacco Use   Smoking status: Never   Smokeless tobacco: Never  Vaping Use   Vaping status: Never Used  Substance and Sexual Activity   Alcohol use: Not Currently   Drug use: Not Currently   Sexual activity: Not Currently    Birth control/protection: None, Abstinence  Other Topics Concern   Not on file  Social History Narrative   Not on file   Social Drivers of Health   Financial Resource Strain: Medium Risk (04/23/2024)   Overall Financial Resource Strain (CARDIA)    Difficulty of Paying Living Expenses: Somewhat hard  Food Insecurity: No Food Insecurity (04/23/2024)   Hunger Vital Sign    Worried About Running Out of Food in the Last Year: Never true    Ran Out of Food in the Last Year: Never true   Transportation Needs: No Transportation Needs (04/23/2024)   PRAPARE - Administrator, Civil Service (Medical): No    Lack of Transportation (Non-Medical): No  Physical Activity: Inactive (04/23/2024)   Exercise Vital Sign    Days of Exercise per Week: 0 days    Minutes of Exercise per Session: 0 min  Stress: No Stress Concern Present (04/23/2024)   [employer] of Occupational Health - Occupational Stress Questionnaire    Feeling of Stress: Not at all  Social Connections: Moderately Integrated (04/23/2024)   Social Connection and Isolation Panel    Frequency of Communication with Friends and Family: More than three times a week    Frequency of Social Gatherings with Friends and Family: Once a week    Attends Religious Services: More than 4 times per year    Active Member of [employer] or Organizations: No    Attends Engineer, Structural: More than 4 times per year    Marital Status: Separated    Family History  Problem Relation Age of Onset   Hypertension Mother    Hyperlipidemia Mother  Cancer Father        brain   Breast cancer Maternal Aunt      Current Outpatient Medications:    cholecalciferol (VITAMIN D3) 25 MCG (1000 UNIT) tablet, Take 1,000 Units by mouth daily., Disp: , Rfl:    citalopram  (CELEXA ) 40 MG tablet, Take 1 tablet by mouth once daily, Disp: 90 tablet, Rfl: 0   Dextran 70-Hypromellose (ARTIFICIAL TEARS) 0.1-0.3 % SOLN, Place 1 drop into both eyes daily as needed (dry eyes)., Disp: , Rfl:    lovastatin  (MEVACOR ) 40 MG tablet, Take 1 tablet (40 mg total) by mouth daily., Disp: 90 tablet, Rfl: 3   Multiple Vitamins-Minerals (MULTIVITAMIN WOMEN PO), Take 1 tablet by mouth daily., Disp: , Rfl:    omeprazole  (PRILOSEC) 40 MG capsule, Take 1 capsule (40 mg total) by mouth daily., Disp: 30 capsule, Rfl: 3   busPIRone  (BUSPAR ) 10 MG tablet, Take 1 tablet (10 mg total) by mouth 2 (two) times daily. (Patient not taking: Reported on 04/23/2024), Disp:  60 tablet, Rfl: 3   fluticasone  (FLONASE ) 50 MCG/ACT nasal spray, Place 2 sprays into both nostrils daily. (Patient not taking: Reported on 04/23/2024), Disp: 16 g, Rfl: 6  Review of Systems  Review of Systems  Constitutional: Negative for fever, chills, weight loss, malaise/fatigue and diaphoresis.  HENT: Negative for hearing loss, ear pain, nosebleeds, congestion, sore throat, neck pain, tinnitus and ear discharge.   Eyes: Negative for blurred vision, double vision, photophobia, pain, discharge and redness.  Respiratory: Negative for cough, hemoptysis, sputum production, shortness of breath, wheezing and stridor.   Cardiovascular: Negative for chest pain, palpitations, orthopnea, claudication, leg swelling and PND.  Gastrointestinal: negative for abdominal pain. Negative for heartburn, nausea, vomiting, diarrhea, constipation, blood in stool and melena.  Genitourinary: Negative for dysuria, urgency, frequency, hematuria and flank pain.  Musculoskeletal: Negative for myalgias, back pain, joint pain and falls.  Skin: Negative for itching and rash.  Neurological: Negative for dizziness, tingling, tremors, sensory change, speech change, focal weakness, seizures, loss of consciousness, weakness and headaches.  Endo/Heme/Allergies: Negative for environmental allergies and polydipsia. Does not bruise/bleed easily.  Psychiatric/Behavioral: Negative for depression, suicidal ideas, hallucinations, memory loss and substance abuse. The patient is not nervous/anxious and does not have insomnia.        Objective:  Blood pressure 136/83, pulse 74, height 5' 2 (1.575 m), weight 181 lb (82.1 kg), last menstrual period 10/26/2023.   Physical Exam  Vitals reviewed. Constitutional: She is oriented to person, place, and time. She appears well-developed and well-nourished.  HENT:  Head: Normocephalic and atraumatic.        Right Ear: External ear normal.  Left Ear: External ear normal.  Nose: Nose normal.   Mouth/Throat: Oropharynx is clear and moist.  Eyes: Conjunctivae and EOM are normal. Pupils are equal, round, and reactive to light. Right eye exhibits no discharge. Left eye exhibits no discharge. No scleral icterus.  Neck: Normal range of motion. Neck supple. No tracheal deviation present. No thyromegaly present.  Cardiovascular: Normal rate, regular rhythm, normal heart sounds and intact distal pulses.  Exam reveals no gallop and no friction rub.   No murmur heard. Respiratory: Effort normal and breath sounds normal. No respiratory distress. She has no wheezes. She has no rales. She exhibits no tenderness.  GI: Soft. Bowel sounds are normal. She exhibits no distension and no mass. There is no tenderness. There is no rebound and no guarding.  Genitourinary:  Breasts no masses skin changes or nipple changes bilaterally  Vulva is normal without lesions Vagina is pink moist without discharge Cervix normal in appearance and pap is done no polyps noted Uterus is normal size shape and contour Adnexa is negative with normal sized ovaries   Musculoskeletal: Normal range of motion. She exhibits no edema and no tenderness.  Neurological: She is alert and oriented to person, place, and time. She has normal reflexes. She displays normal reflexes. No cranial nerve deficit. She exhibits normal muscle tone. Coordination normal.  Skin: Skin is warm and dry. No rash noted. No erythema. No pallor.  Psychiatric: She has a normal mood and affect. Her behavior is normal. Judgment and thought content normal.       Medications Ordered at today's visit: No orders of the defined types were placed in this encounter.   Other orders placed at today's visit: No orders of the defined types were placed in this encounter.    ASSESSMENT + PLAN:    ICD-10-CM   1. Well woman exam with routine gynecological exam  Z01.419     2. Encounter for Papanicolaou smear of vagina as part of routine gynecological  examination  Z01.419    Z12.72           Return in about 3 years (around 04/24/2027) for yearly.

## 2024-04-23 NOTE — Addendum Note (Signed)
 Addended by: ILEAN RUTHERFORD HERO on: 04/23/2024 04:36 PM   Modules accepted: Orders

## 2024-04-26 LAB — CYTOLOGY - PAP
Comment: NEGATIVE
Diagnosis: NEGATIVE
High risk HPV: NEGATIVE

## 2024-04-28 ENCOUNTER — Other Ambulatory Visit: Payer: Self-pay | Admitting: Internal Medicine

## 2024-04-28 DIAGNOSIS — F3341 Major depressive disorder, recurrent, in partial remission: Secondary | ICD-10-CM

## 2024-05-11 ENCOUNTER — Other Ambulatory Visit: Payer: Self-pay | Admitting: Internal Medicine

## 2024-05-11 DIAGNOSIS — E782 Mixed hyperlipidemia: Secondary | ICD-10-CM

## 2024-06-26 ENCOUNTER — Ambulatory Visit: Admitting: Internal Medicine
